# Patient Record
Sex: Female | Born: 1965 | Race: White | Hispanic: No | Marital: Married | Smoking: Former smoker
Health system: Southern US, Community
[De-identification: ages and names within clinical notes are randomized; demographics above are authoritative.]

## PROBLEM LIST (undated history)

## (undated) DIAGNOSIS — N926 Irregular menstruation, unspecified: Secondary | ICD-10-CM

## (undated) DIAGNOSIS — N920 Excessive and frequent menstruation with regular cycle: Secondary | ICD-10-CM

## (undated) HISTORY — PX: APPENDECTOMY: SHX54

## (undated) HISTORY — DX: Excessive and frequent menstruation with regular cycle: N92.0

## (undated) HISTORY — PX: GALLBLADDER SURGERY: SHX652

## (undated) HISTORY — PX: CHOLECYSTECTOMY: SHX55

## (undated) HISTORY — DX: Irregular menstruation, unspecified: N92.6

---

## 1998-08-26 ENCOUNTER — Other Ambulatory Visit: Admission: RE | Admit: 1998-08-26 | Discharge: 1998-08-26 | Payer: Self-pay | Admitting: Obstetrics and Gynecology

## 1999-08-27 ENCOUNTER — Other Ambulatory Visit: Admission: RE | Admit: 1999-08-27 | Discharge: 1999-08-27 | Payer: Self-pay | Admitting: Obstetrics and Gynecology

## 2000-09-22 ENCOUNTER — Other Ambulatory Visit: Admission: RE | Admit: 2000-09-22 | Discharge: 2000-09-22 | Payer: Self-pay | Admitting: Obstetrics and Gynecology

## 2009-09-09 ENCOUNTER — Ambulatory Visit: Payer: Self-pay | Admitting: Nurse Practitioner

## 2009-09-21 ENCOUNTER — Ambulatory Visit: Payer: Self-pay | Admitting: Nurse Practitioner

## 2009-09-21 IMAGING — US ULTRASOUND RIGHT BREAST
1 series · 7 of 7 positions shown · non-contrast
Comparison: none

REASON FOR EXAM: Bilateral density
COMMENTS:

[Series 1: ultrasound right breast · 7 of 7 slices shown]
[im 1/7]
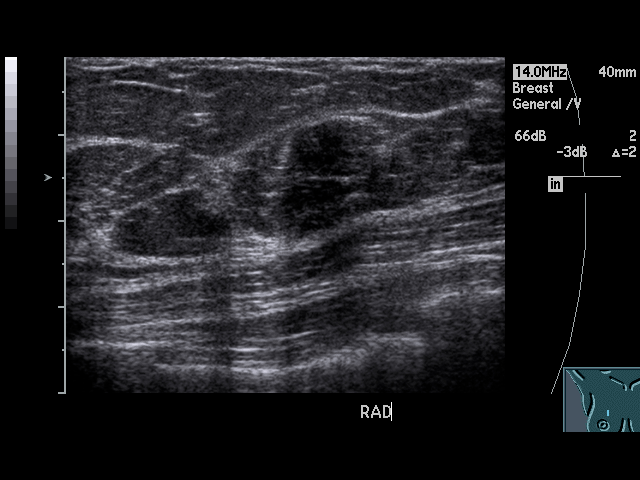
[im 2/7]
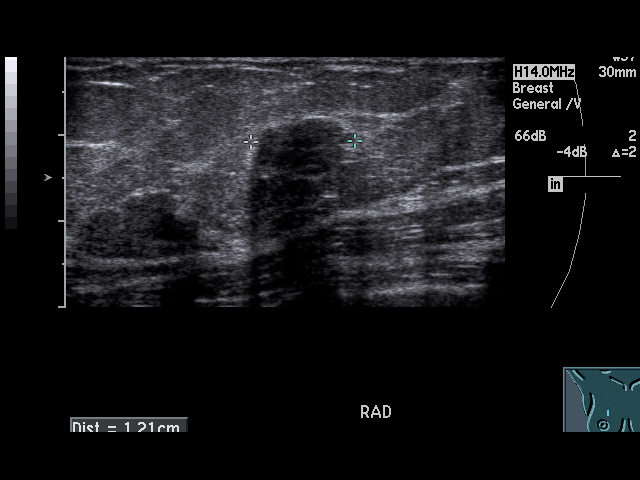
[im 3/7]
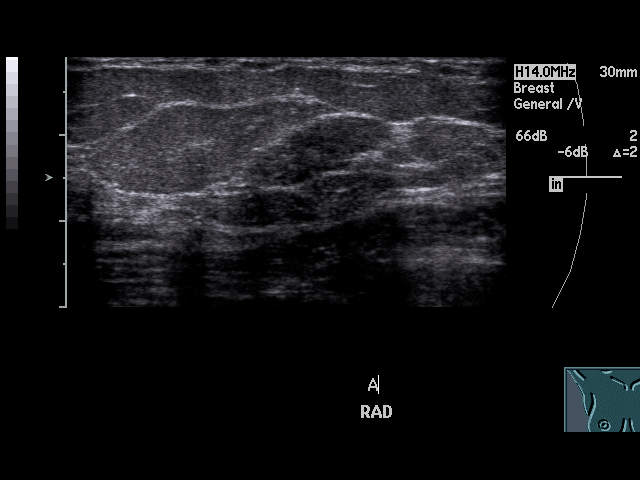
[im 4/7]
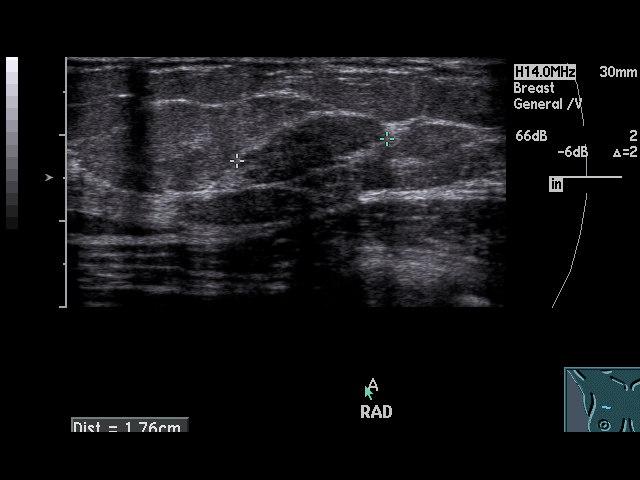
[im 5/7]
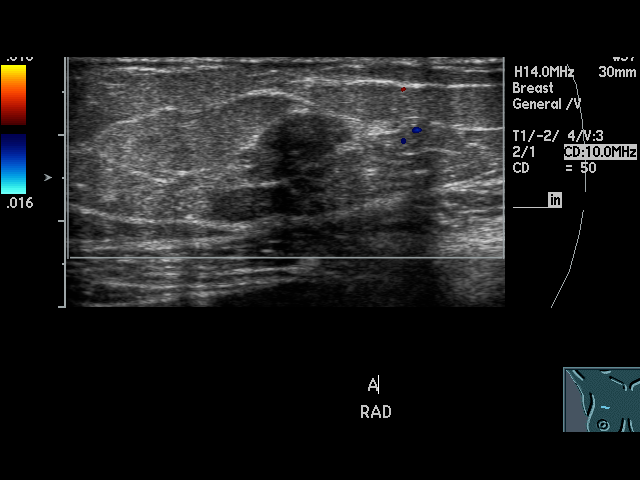
[im 6/7]
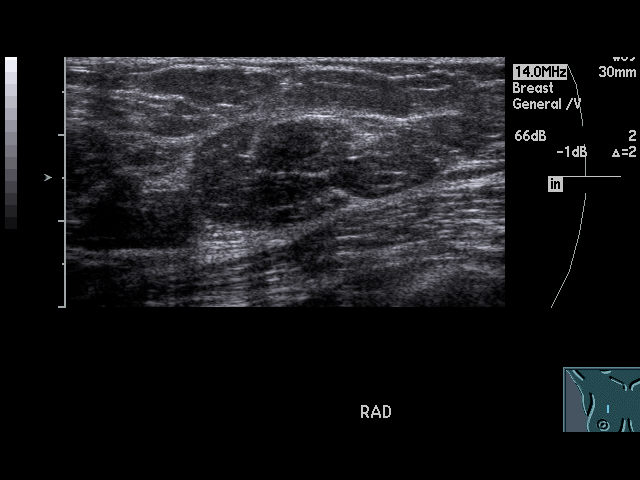
[im 7/7]
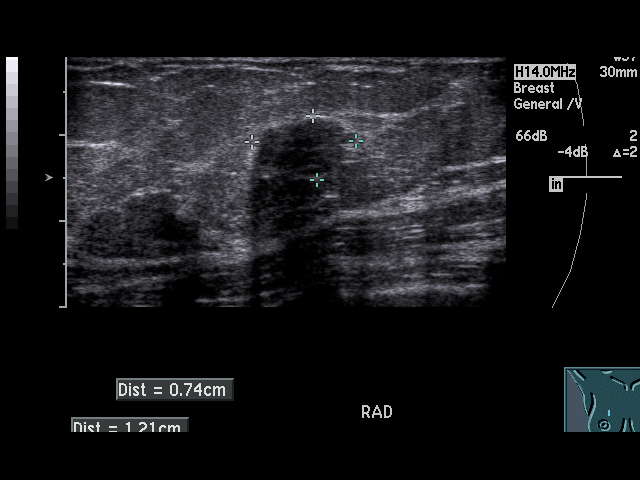

[7 of 7 positions shown; findings below may reference images not displayed]

PROCEDURE:     US  - US WOODBERRY RT BREAST  - [DATE] [DATE]

RESULT:     The right breast is evaluated in the region of interest.

At the 1 o'clock position a solid appearing nodule is identified measuring
1.2 x .74 x 1.76 cm.  This area is oval shaped and demonstrates a primarily
smoothly marginated border with regions of lobulation. There is acoustic
shadowing associated with this area and no evidence of significant flow.
Sonographic appearance of this nodule is indeterminate. Please refer to the
additional radiographic mammogram for completed discussion.
IMPRESSION: 1.Indeterminate nodule at the 1 o'clock position.

## 2009-09-21 IMAGING — US ULTRASOUND LEFT BREAST
1 series · 10 of 10 positions shown · non-contrast
Comparison: none

REASON FOR EXAM: Bilateral density
COMMENTS:

[Series 1: ultrasound left breast · 10 of 10 slices shown]
[im 1/10]
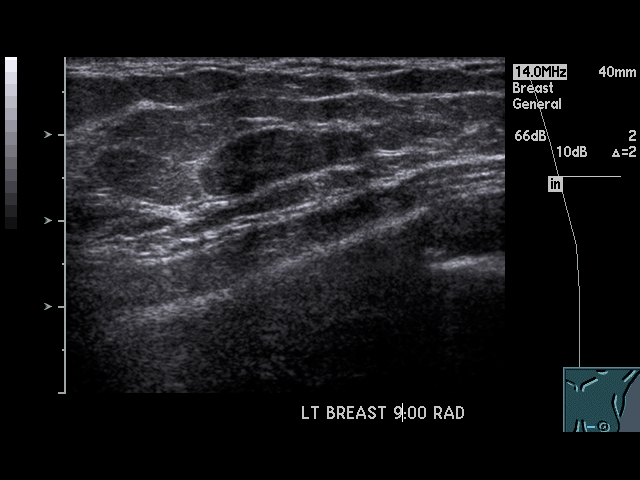
[im 2/10]
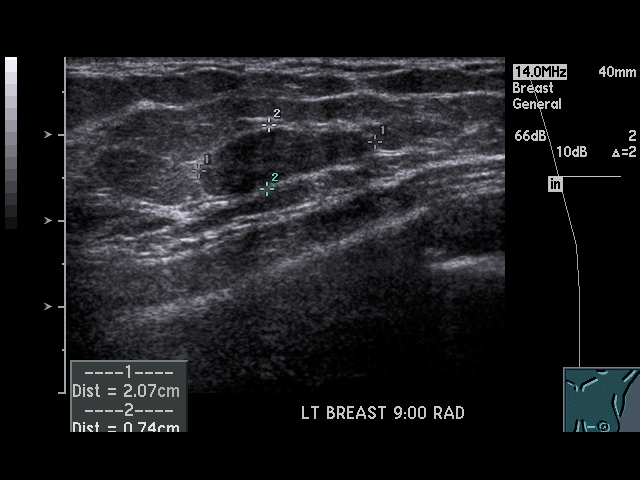
[im 3/10]
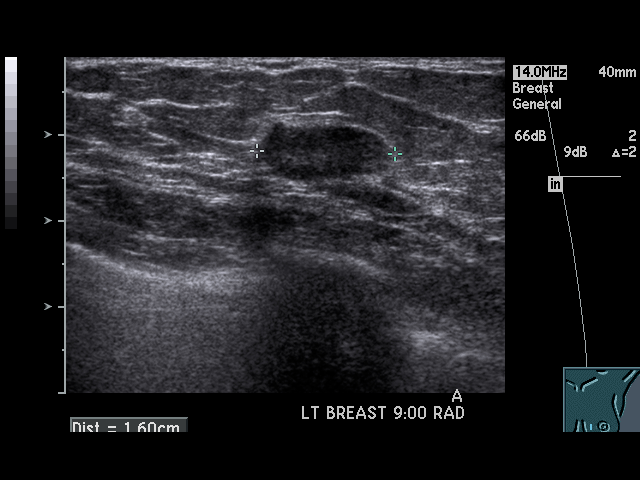
[im 4/10]
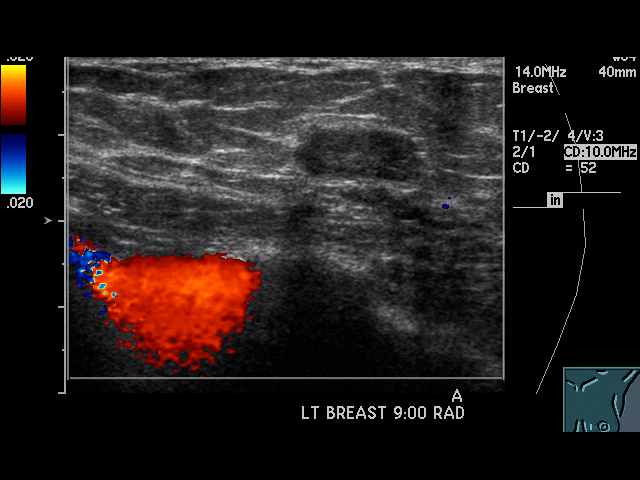
[im 5/10]
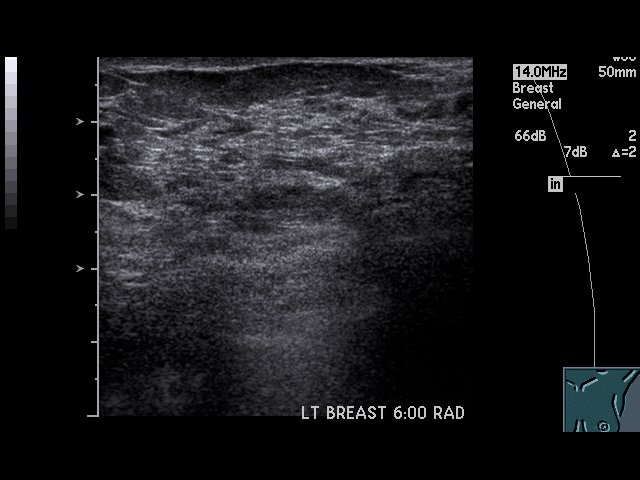
[im 6/10]
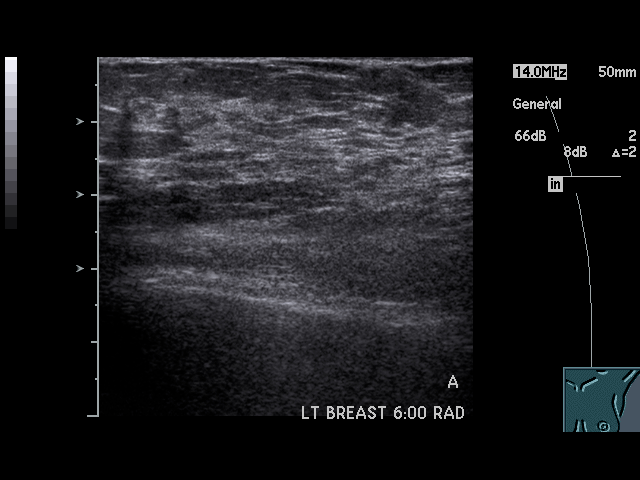
[im 7/10]
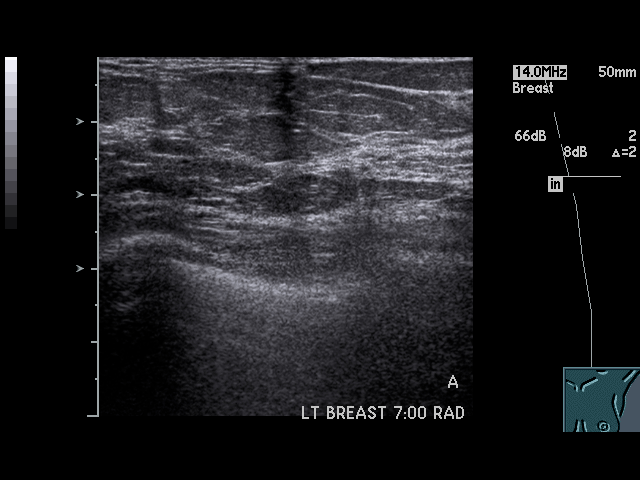
[im 8/10]
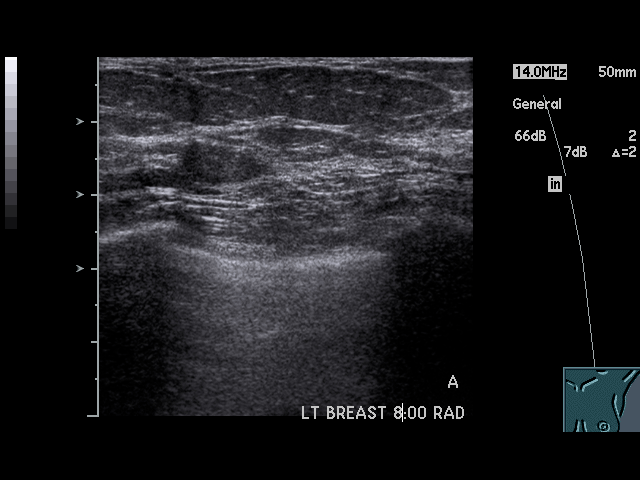
[im 9/10]
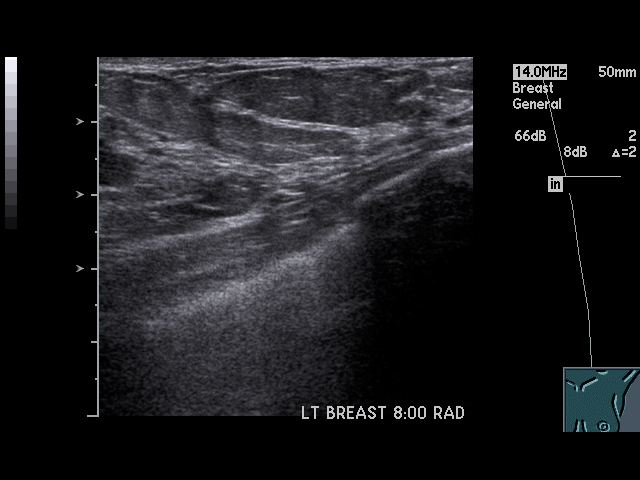
[im 10/10]
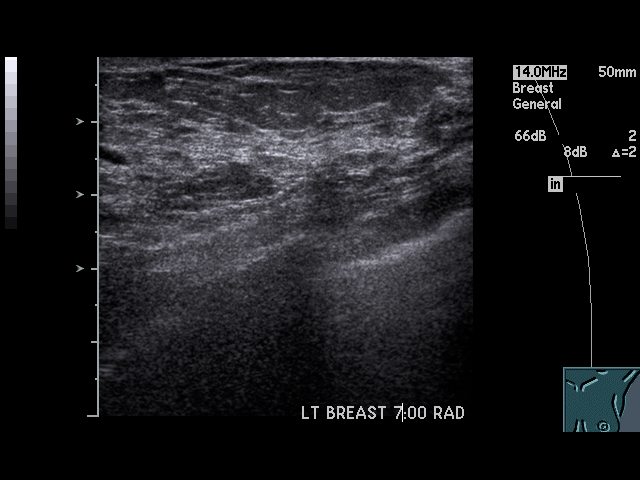

[10 of 10 positions shown; findings below may reference images not displayed]

PROCEDURE:     US  - US CHAHAL LT BREAST  - [DATE] [DATE]

RESULT:       The left breast was evaluated in the region of interest from
the 6 o'clock to the 9 o'clock position.

At the 9 o'clock position, an oval-shaped hypoechoic nodule is identified
demonstrating increased through transmission with a solid appearance. The
borders of this nodule are smoothly marginated. The nodule appears to e
wider than tall and there is no evidence of associated flow.  The
sonographic appearance of this nodule suggest the sequela of a fibroadenoma
which is a benign entity.
IMPRESSION: Findings suggesting benign adenoma in the region of interest at the left
breast. Please refer to the additional mammographic view dictation for
complete discussion.

## 2009-10-02 ENCOUNTER — Ambulatory Visit: Payer: Self-pay

## 2009-10-07 ENCOUNTER — Ambulatory Visit: Payer: Self-pay | Admitting: Surgery

## 2009-10-07 IMAGING — US ULTRASOUND RIGHT BREAST
1 series · 5 of 5 positions shown · non-contrast
Comparison: none

REASON FOR EXAM: Rt Breast Mass
COMMENTS:

[Series 1: ultrasound right breast · 5 of 5 slices shown]
[im 1/5]
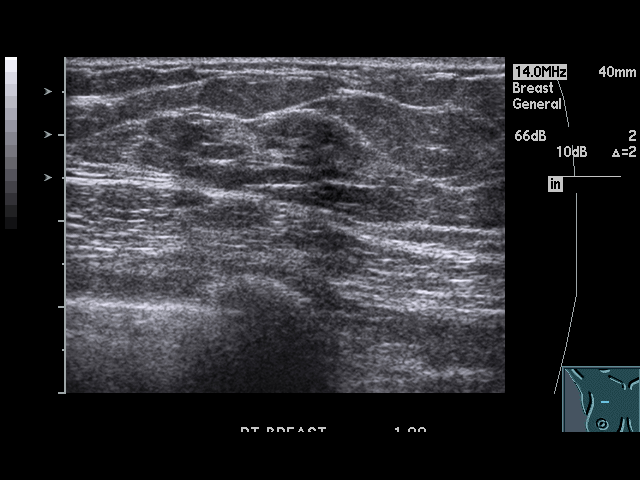
[im 2/5]
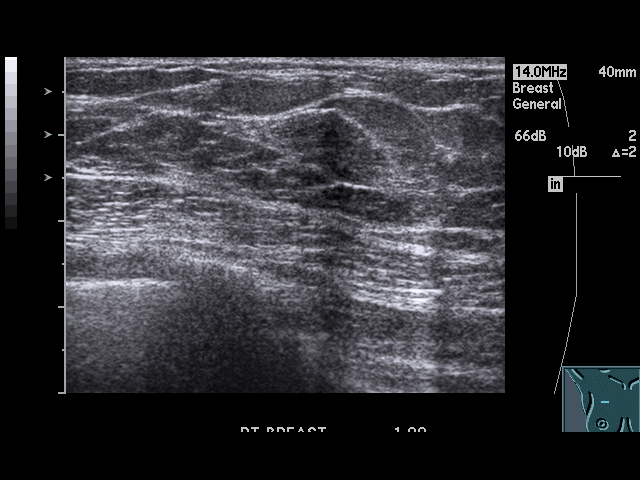
[im 3/5]
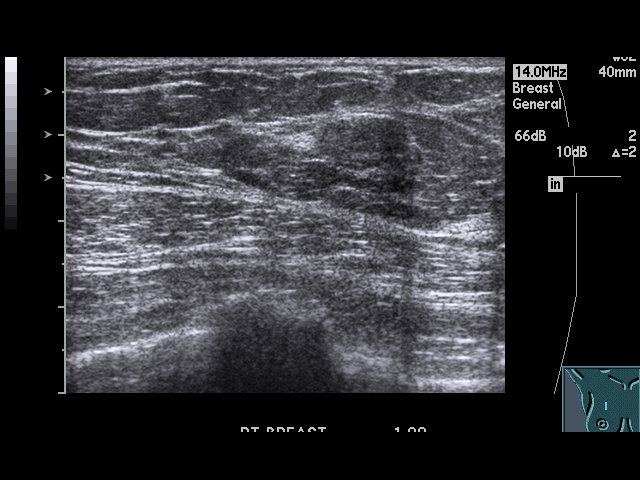
[im 4/5]
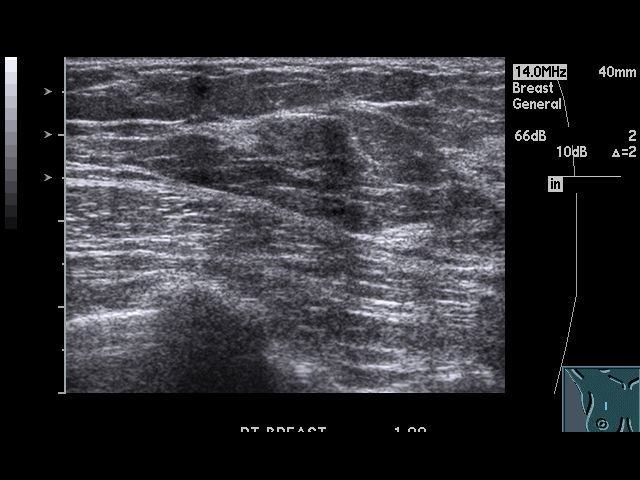
[im 5/5]
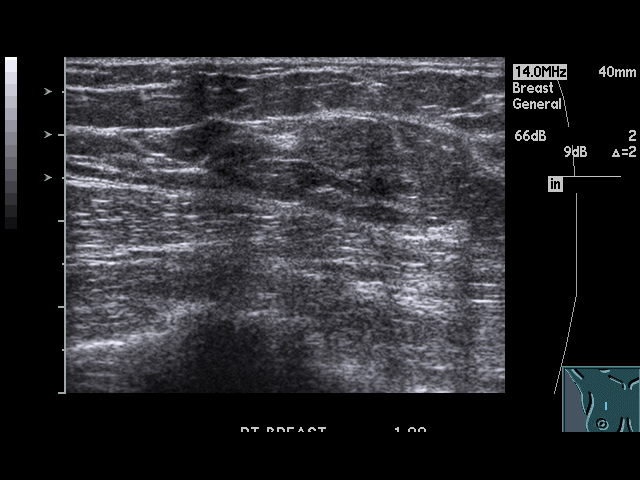

[5 of 5 positions shown; findings below may reference images not displayed]

PROCEDURE:     US  - US BREAST RIGHT  - [DATE]  [DATE]

RESULT:     Right breast ultrasound was performed. Previously identified
questionable lesion in the right breast is no longer identified. The lesion
appears to be normal breast tissue. To assure stability, a followup right
breast ultrasound in 3 months suggested. This was discussed with the patient.
IMPRESSION: Recommend followup right breast ultrasound in 3 months as described above.
Biopsy was not performed as what appeared to represent a lesion on prior
ultrasound appears to represent normal tissue on today's exam.

## 2009-10-07 IMAGING — US US BREAST BX W LOC DEV 1ST LESION IMG BX SPEC US GUIDE
1 series · 8 of 8 positions shown · non-contrast
Comparison: none

REASON FOR EXAM: Lt Breast Mass
COMMENTS:

[Series 1: us breast bx w loc dev 1st lesion img bx spec us g · 8 of 8 slices shown]
[im 1/8]
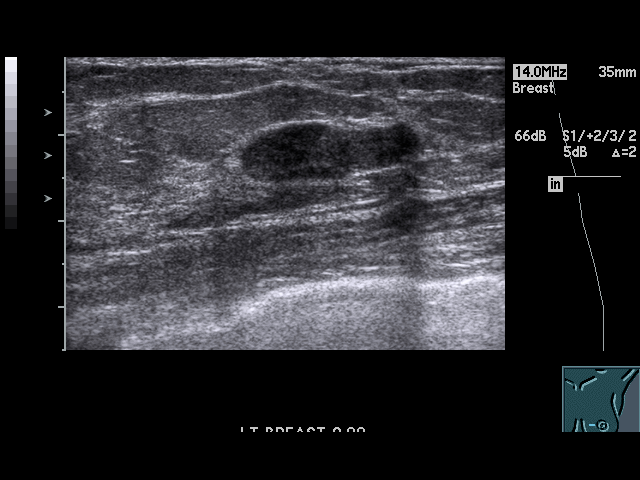
[im 2/8]
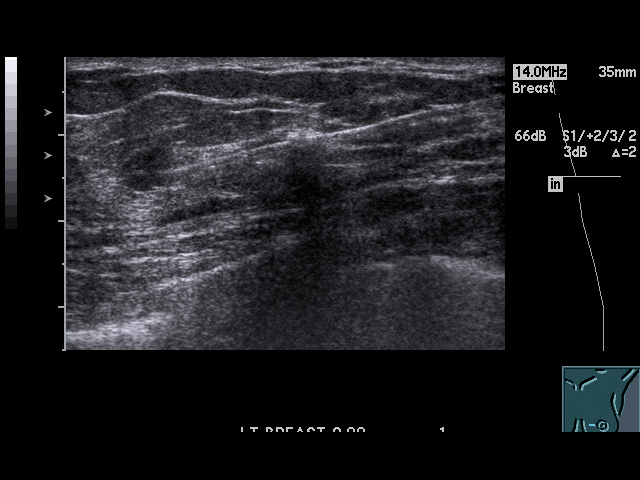
[im 3/8]
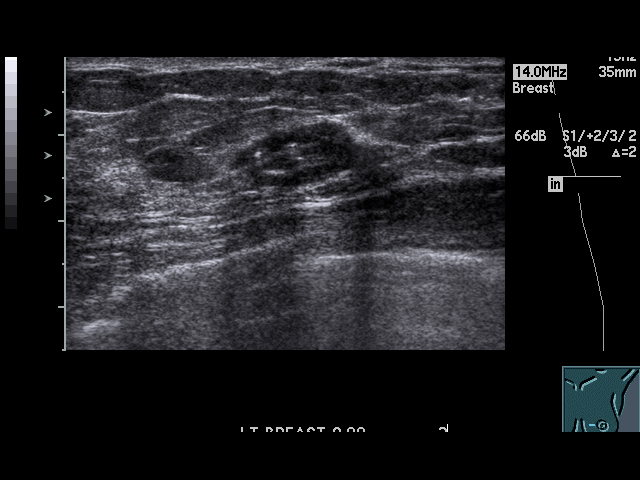
[im 4/8]
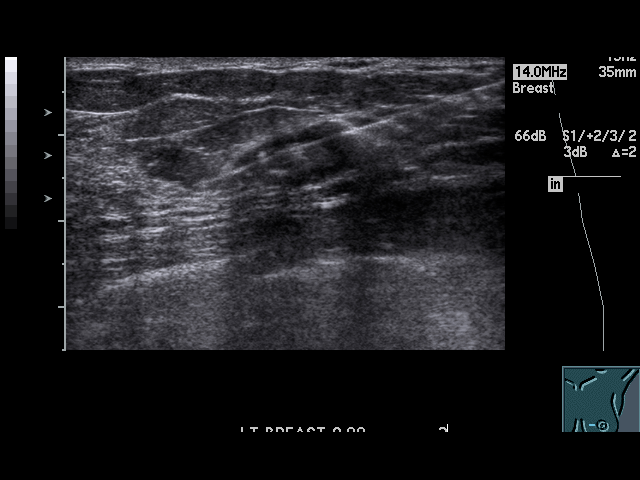
[im 5/8]
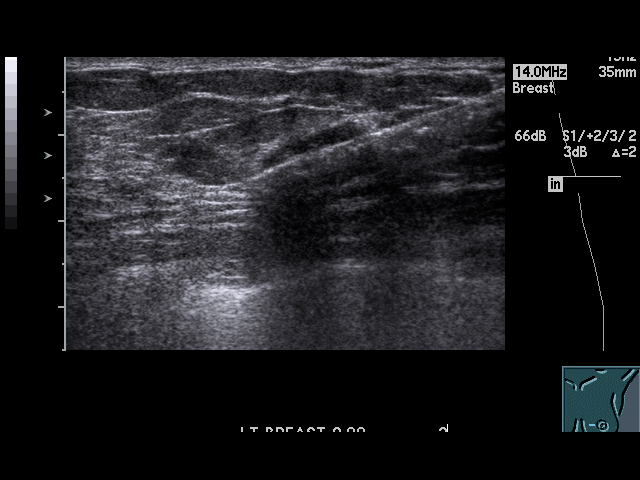
[im 6/8]
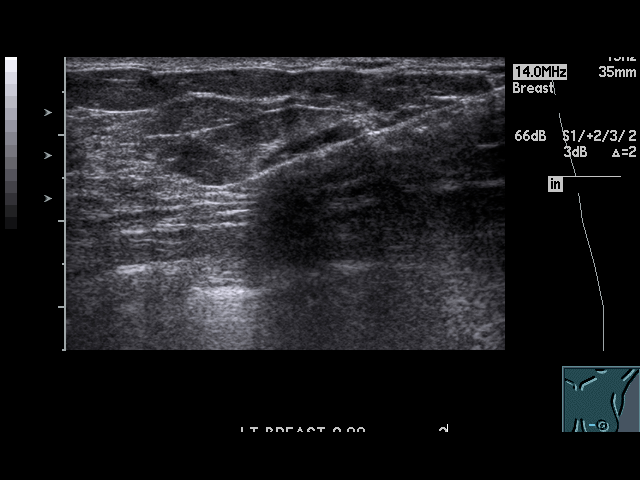
[im 7/8]
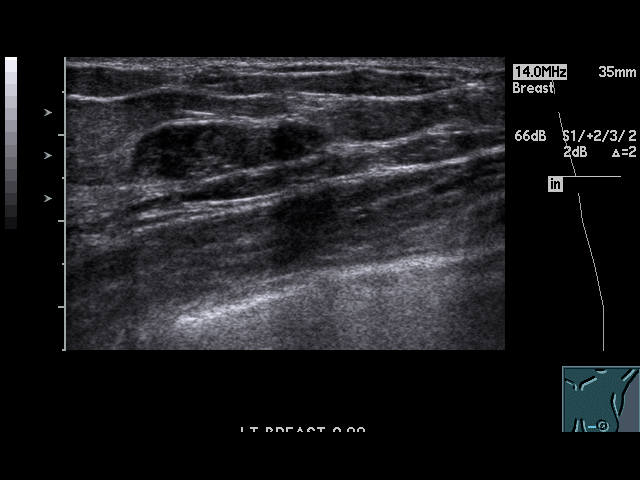
[im 8/8]
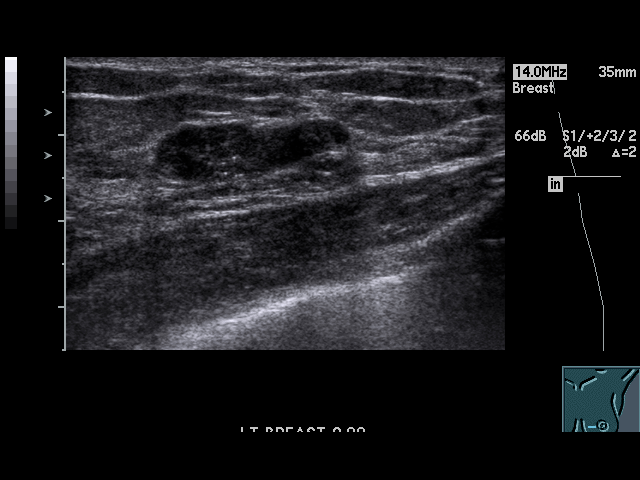

[8 of 8 positions shown; findings below may reference images not displayed]

PROCEDURE:     US  - US GUIDED BIOPSY BREAST LEFT  - [DATE]  [DATE]

RESULT:     After discussing the risks and benefits of this procedure with
the patient, informed consent was obtained. The left breast was sterilely
prepped and draped.  Following local anesthesia with 1% lidocaine, a
16-gauge Achieve Core Biopsy System was advanced into the left breast mass
and multiple core samples were obtained. There were no complications.
IMPRESSION: Successful left breast ultrasound-directed biopsy. If a definitive diagnosis
such as fibroadenoma or malignancy is not returned, surgical removal of this
mass would be indicated.

## 2012-11-06 ENCOUNTER — Ambulatory Visit: Payer: Self-pay | Admitting: Nurse Practitioner

## 2012-11-06 IMAGING — US ULTRASOUND RIGHT BREAST
1 series · 14 of 16 positions shown · non-contrast
Comparison: none

REASON FOR EXAM: RT BR NODULE FU AND YRLY
COMMENTS:

PROCEDURE:     US  - US BREAST RIGHT  - [DATE]  [DATE]
RESULT:

[Series 1: ultrasound right breast · 0.09mm/px · 14 of 16 slices shown]
[im 1/16]
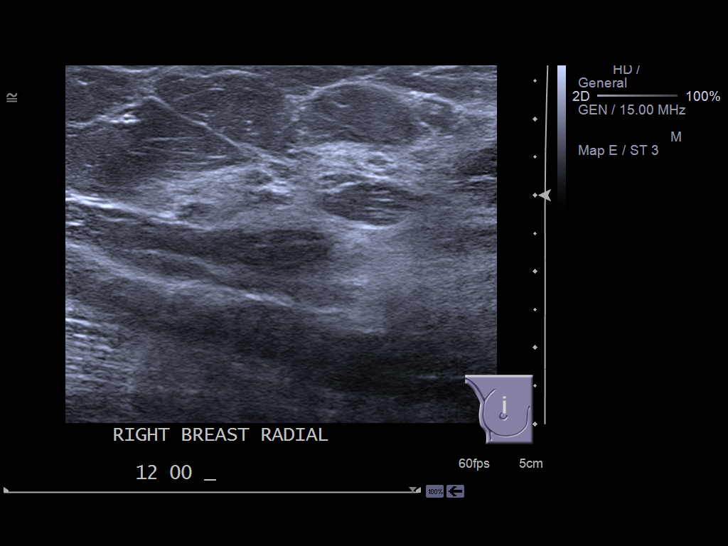
[im 2/16]
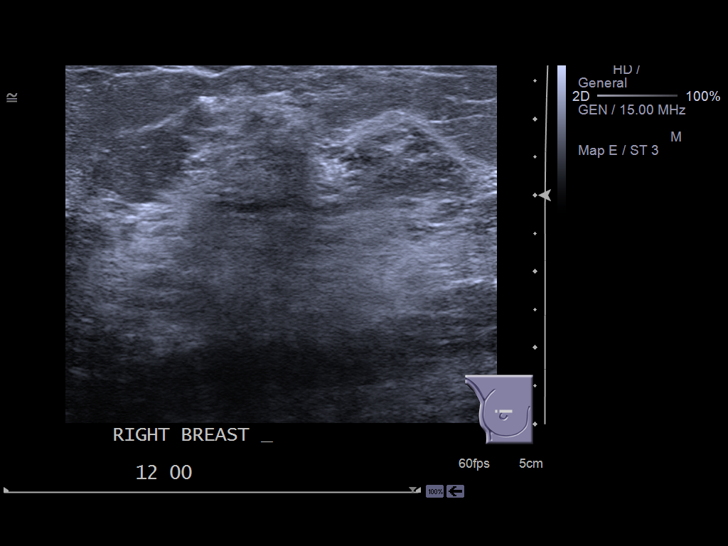
[im 3/16]
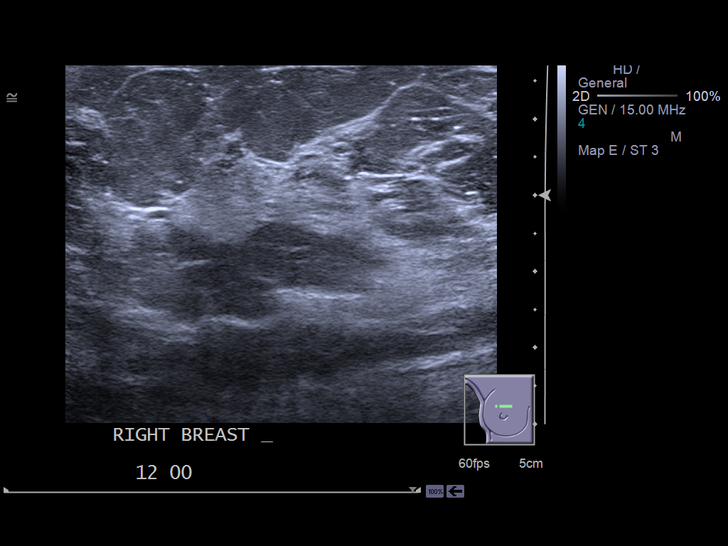
[im 5/16]
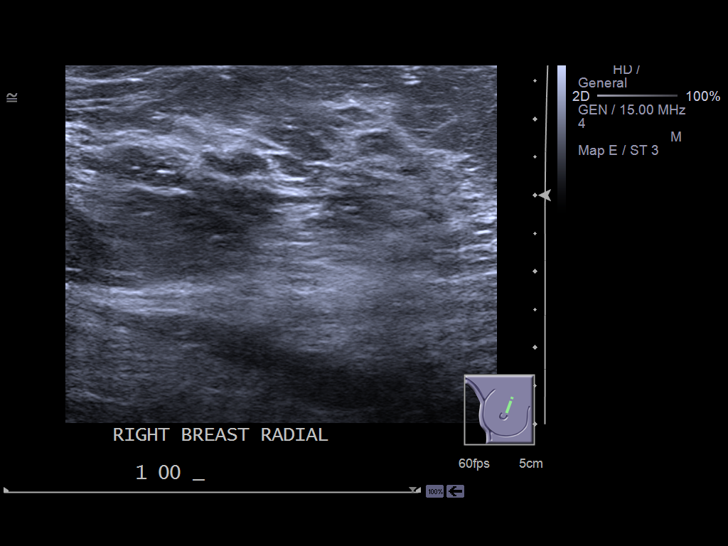
[im 6/16]
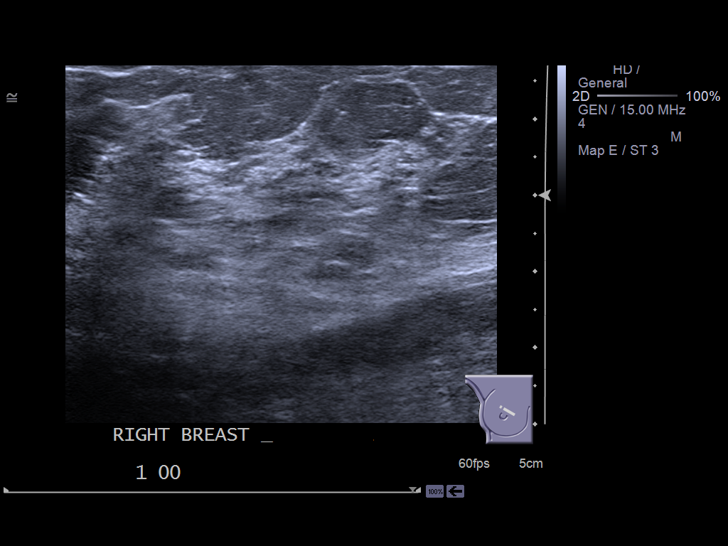
[im 7/16]
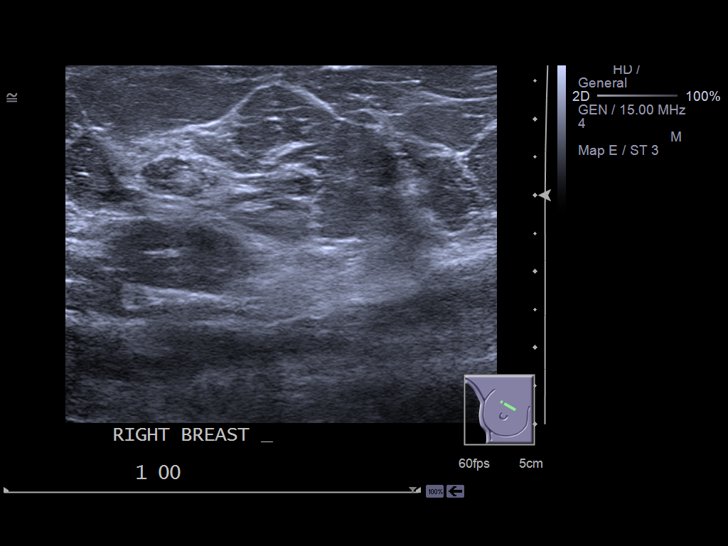
[im 8/16]
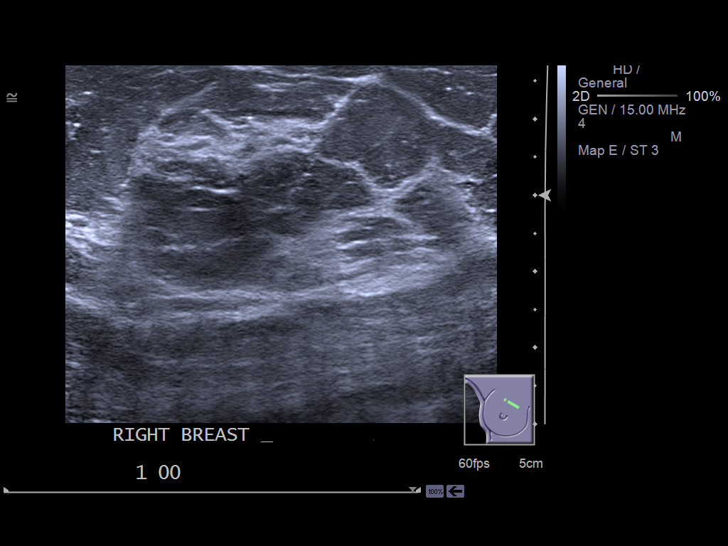
[im 9/16]
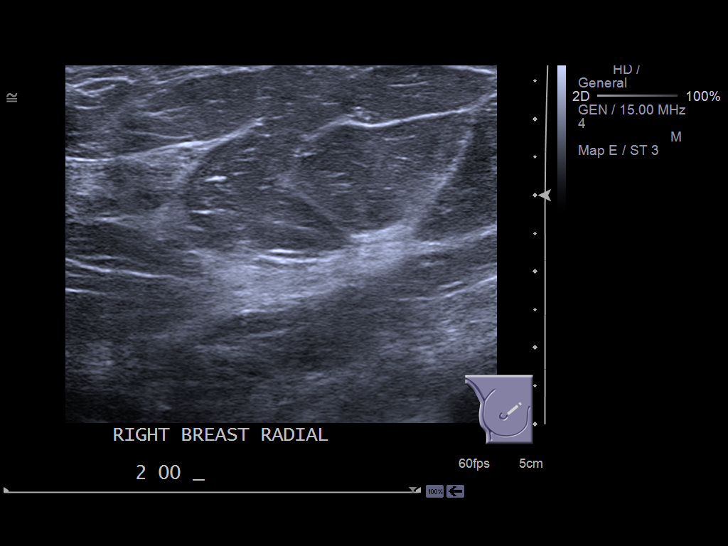
[im 10/16]
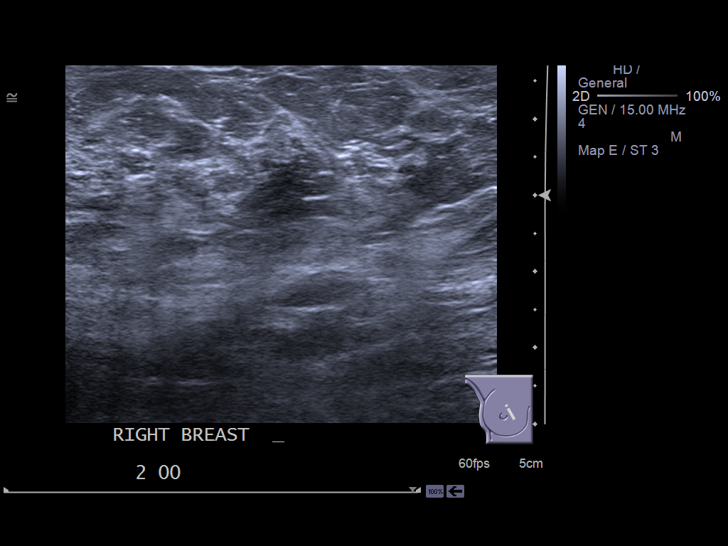
[im 11/16]
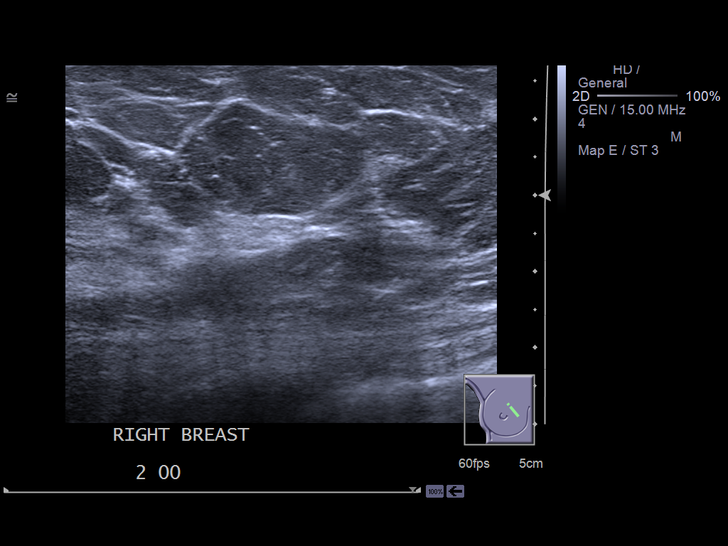
[im 13/16]
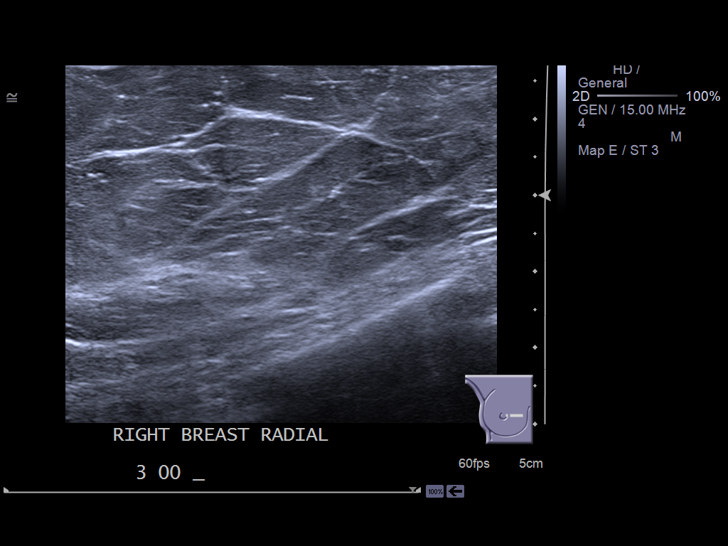
[im 14/16]
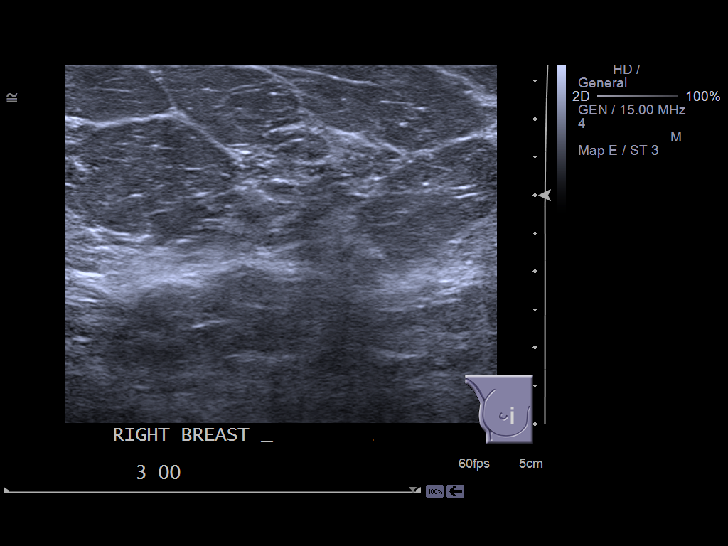
[im 15/16]
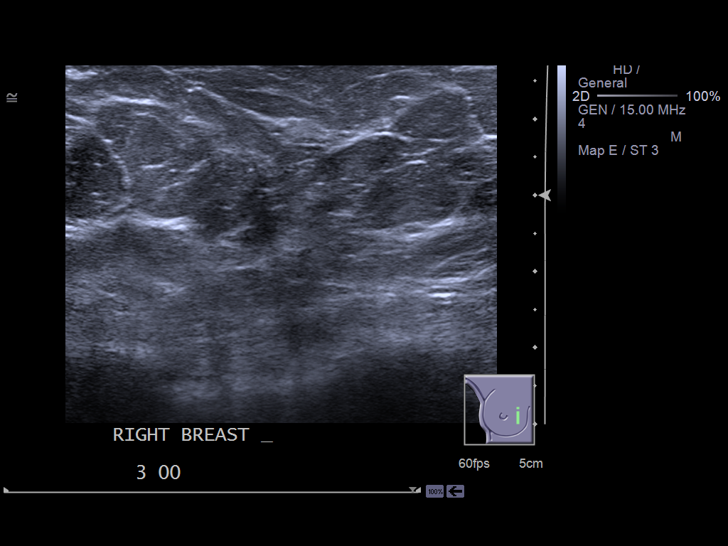
[im 16/16]
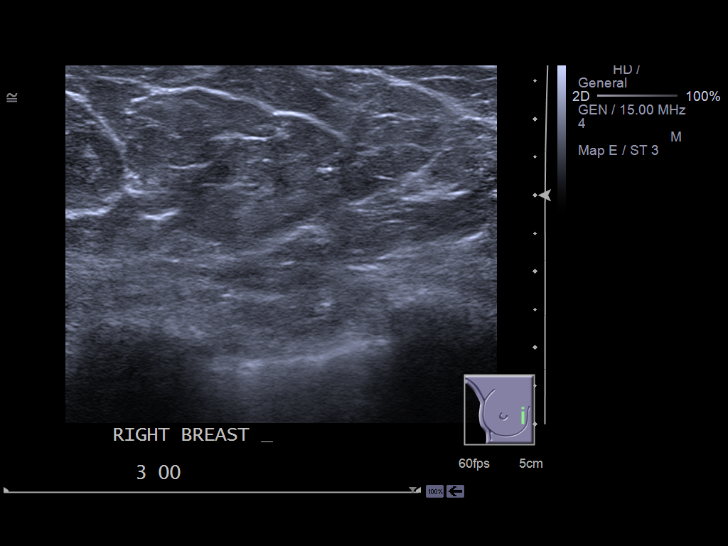

[14 of 16 positions shown; findings below may reference images not displayed]

FINDINGS: The region of interest within the right breast was evaluated from
the [DATE] to [DATE] position. No solid or cystic sonographic abnormality is
identified. This study was compared to a prior study dated [DATE].
IMPRESSION: Unremarkable focused Right Breast Ultrasound.

## 2012-11-13 ENCOUNTER — Ambulatory Visit: Payer: Self-pay | Admitting: Nurse Practitioner

## 2012-11-13 IMAGING — US ULTRASOUND LEFT BREAST
1 series · 14 of 25 positions shown · non-contrast
Comparison: none

REASON FOR EXAM: av lt increased density
COMMENTS:

PROCEDURE:     US  - US BREAST LEFT  - [DATE]  [DATE]
RESULT:

[Series 1: ultrasound left breast · 0.08mm/px · 14 of 39 slices shown]
[im 1/39]
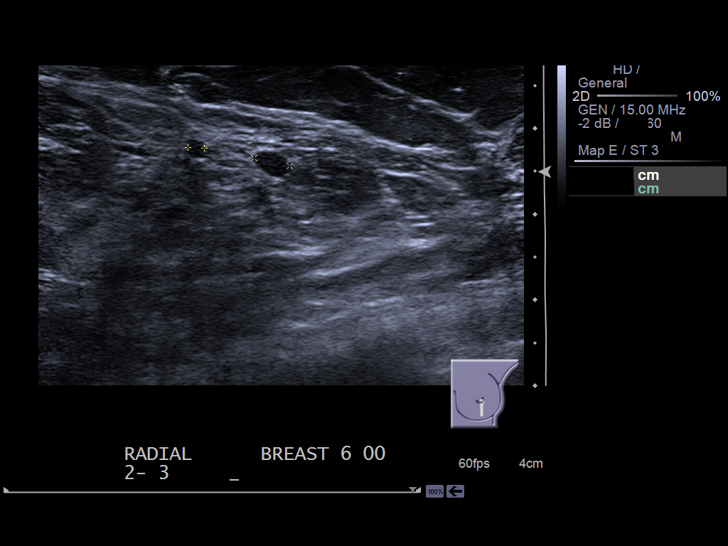
[im 4/39]
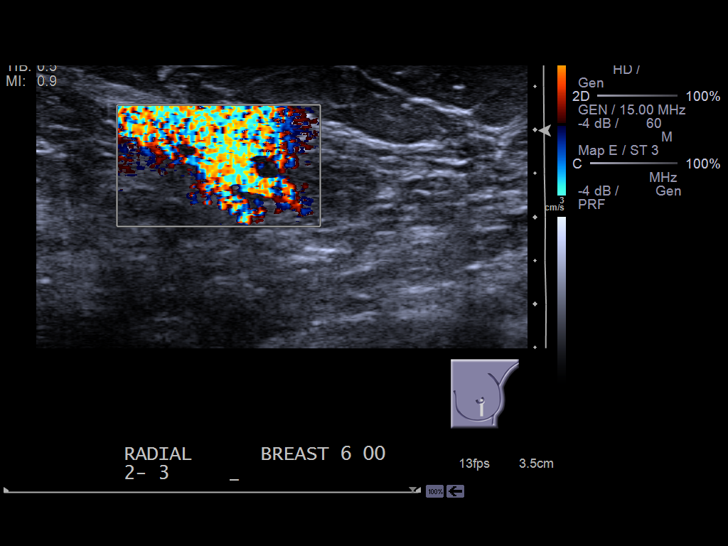
[im 7/39]
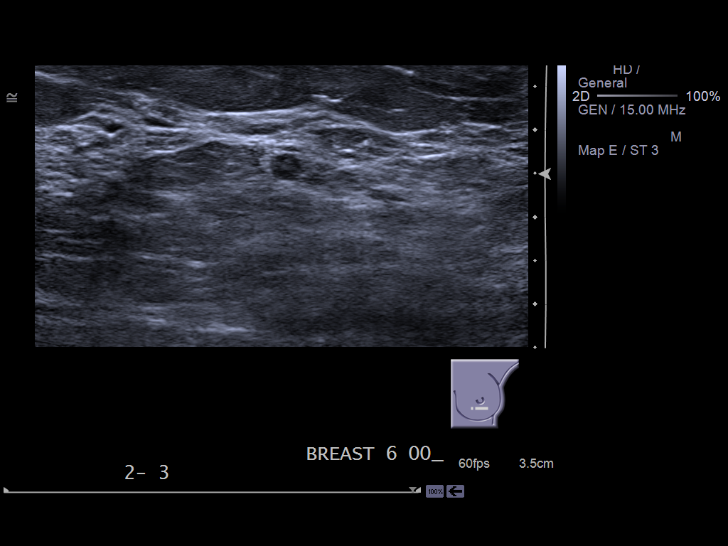
[im 10/39]
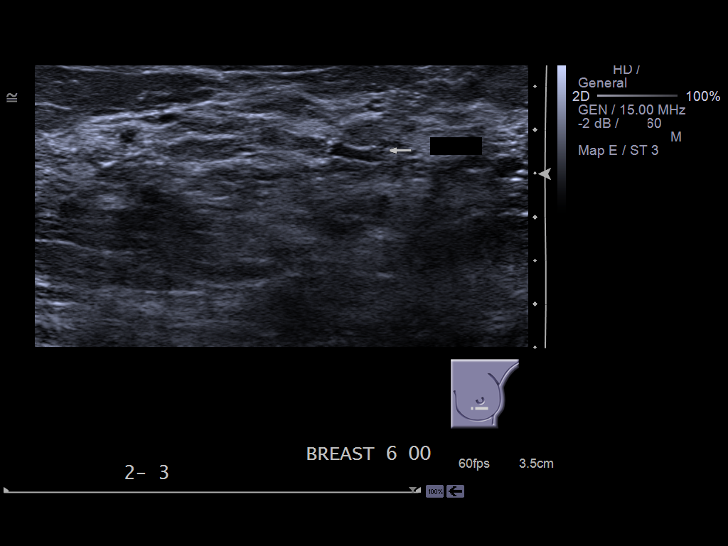
[im 13/39]
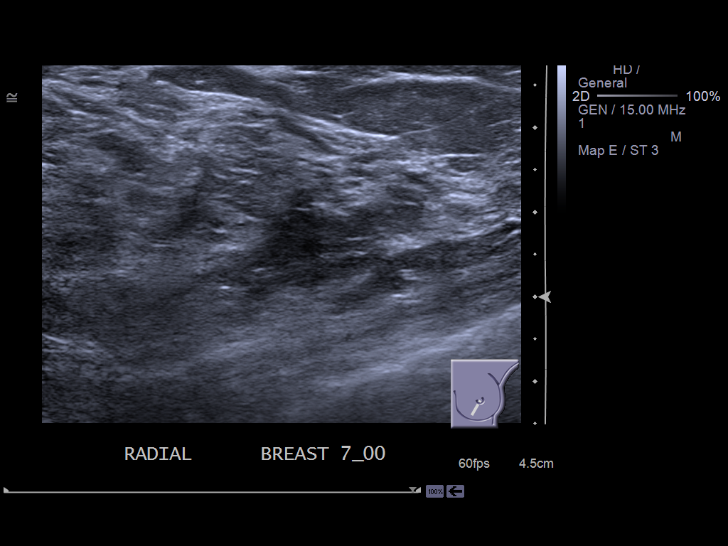
[im 15/39]
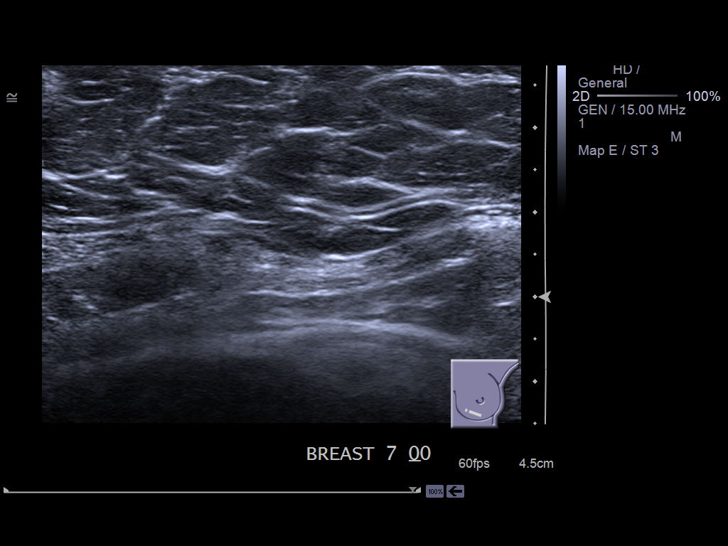
[im 18/39]
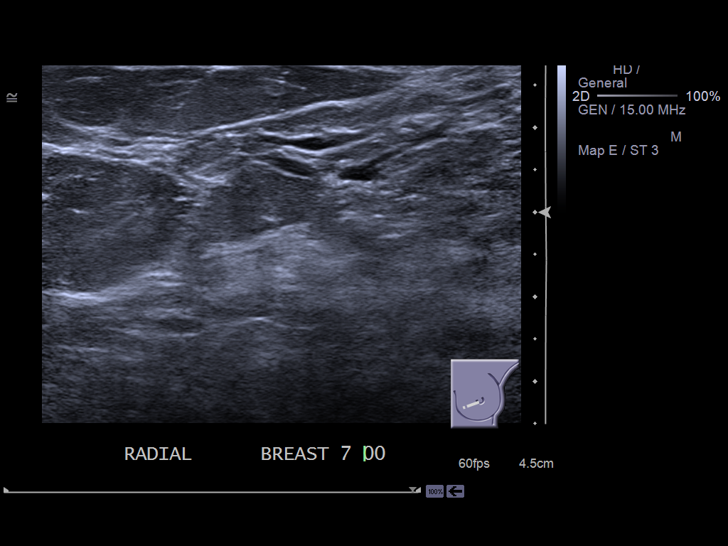
[im 21/39]
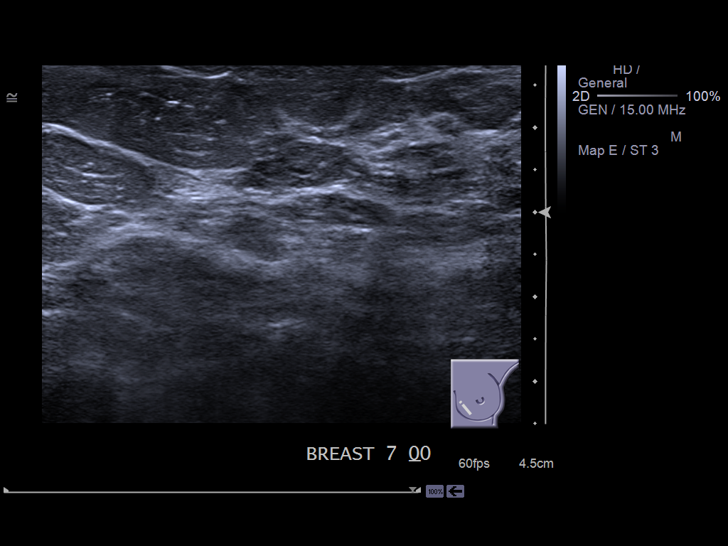
[im 24/39]
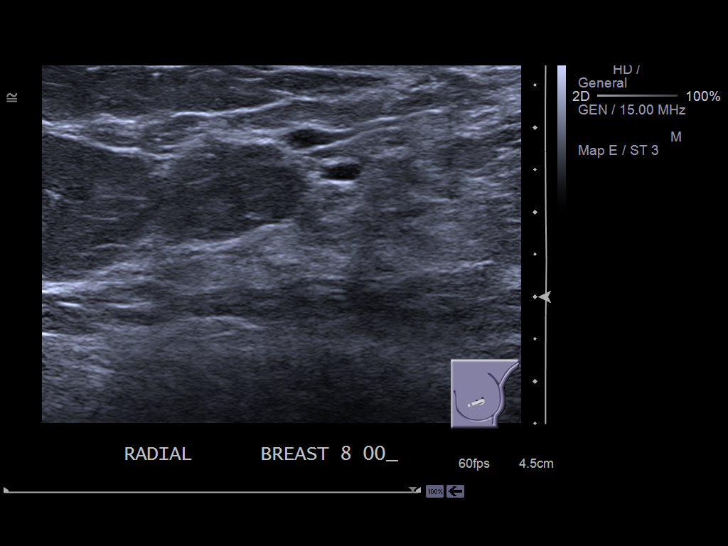
[im 26/39]
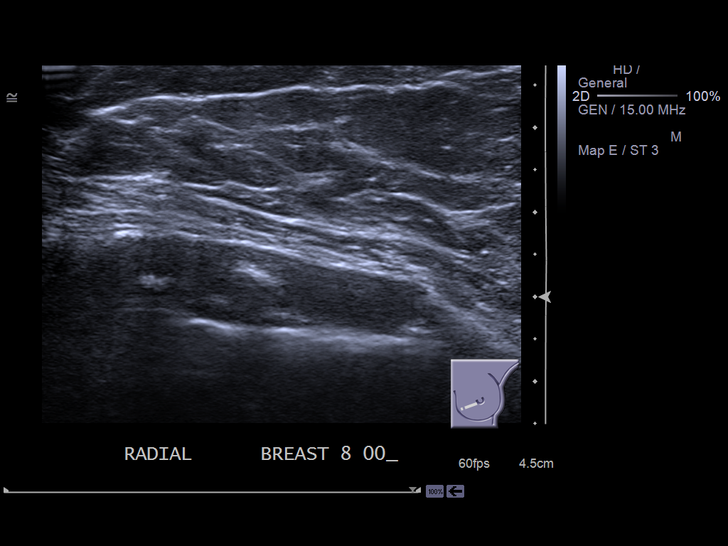
[im 29/39]
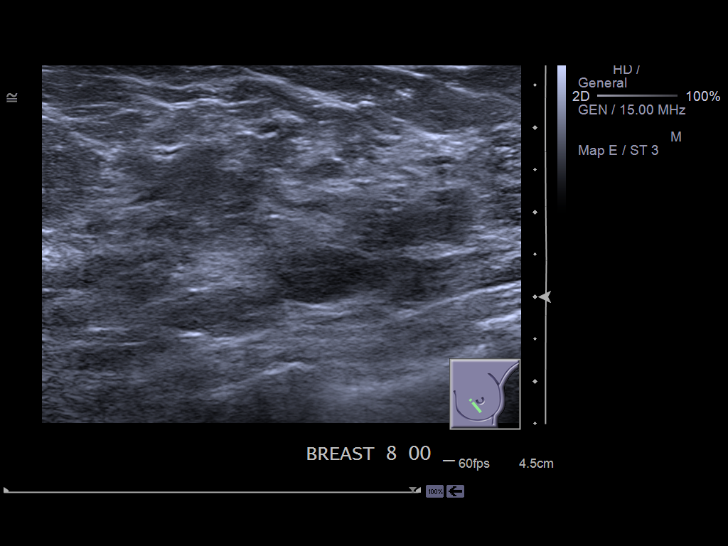
[im 32/39]
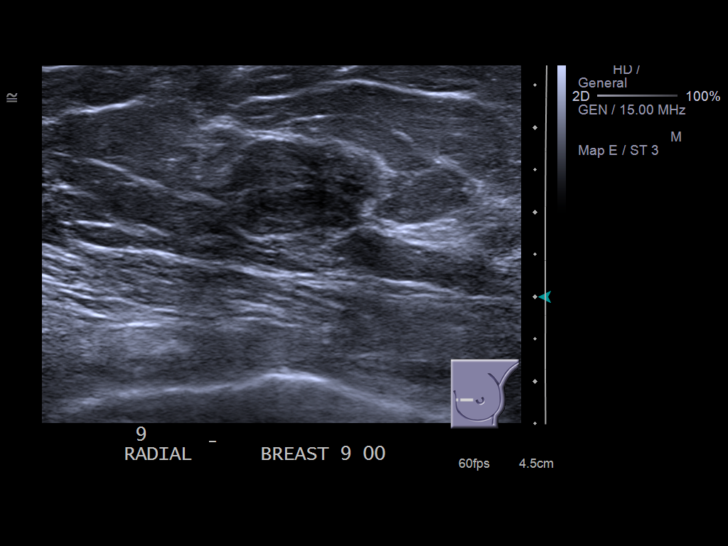
[im 35/39]
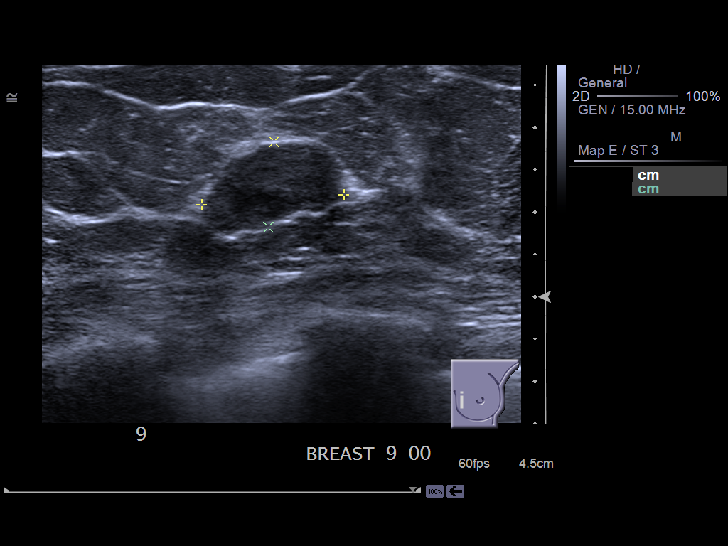
[im 39/39]
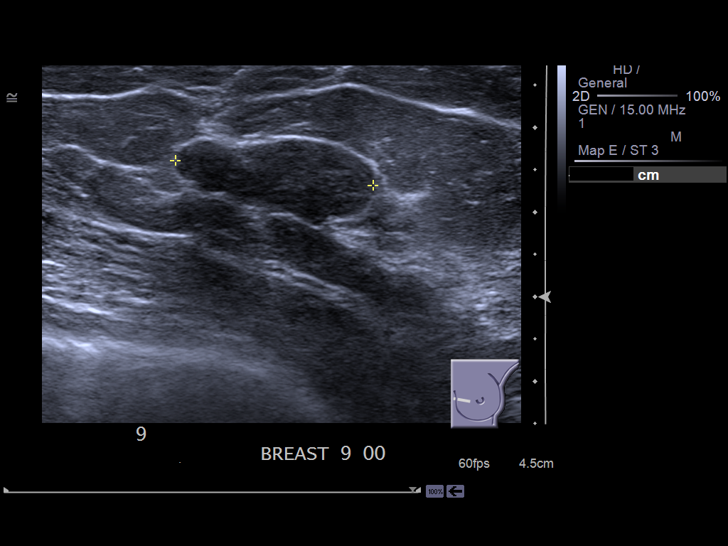

[14 of 25 positions shown; findings below may reference images not displayed]

FINDINGS: The left breast was evaluated in the region of interest. At the
[DATE] position, 3 cm from the nipple, a 0.42 x 0.32 x 0.88 cm, hypoechoic
area is identified. A smaller adjacent area is also appreciated which
appears to be consistent with a duct. At the [DATE] position, about 9 cm from
the nipple, a 2.4 x 1.68 x 1.01 cm, smoothly marginated, hypoechoic nodule
is appreciated, biopsy proven benign fibroadenoma. The findings at the [DATE]
position are likely benign and reevaluation in six months is recommended.
IMPRESSION: Likely benign findings. Six month reevaluation is
recommended.

## 2013-05-15 ENCOUNTER — Ambulatory Visit: Payer: Self-pay

## 2013-05-23 ENCOUNTER — Ambulatory Visit: Payer: Self-pay

## 2013-05-23 IMAGING — US ULTRASOUND LEFT BREAST
1 series · 4 of 4 positions shown · non-contrast
Comparison: Recent mammogram and prior exams [DATE] and
earlier

On physical exam, I palpate no abnormality in the left breast nine
o'clock area.

REASON FOR EXAM: us lt density ABACHO ph [PHONE_NUMBER] fx [phone]
[PHONE_NUMBER]
COMMENTS:

PROCEDURE:     MAM - MAM US BREAST LEFT  - [DATE] [DATE]
CLINICAL DATA: Short-term follow-up left breast mass 9 o'clock
location.
LEFT BREAST ULTRASOUND

[Series 1: ultrasound left breast · 0.08mm/px · 4 of 4 slices shown]
[im 1/4]
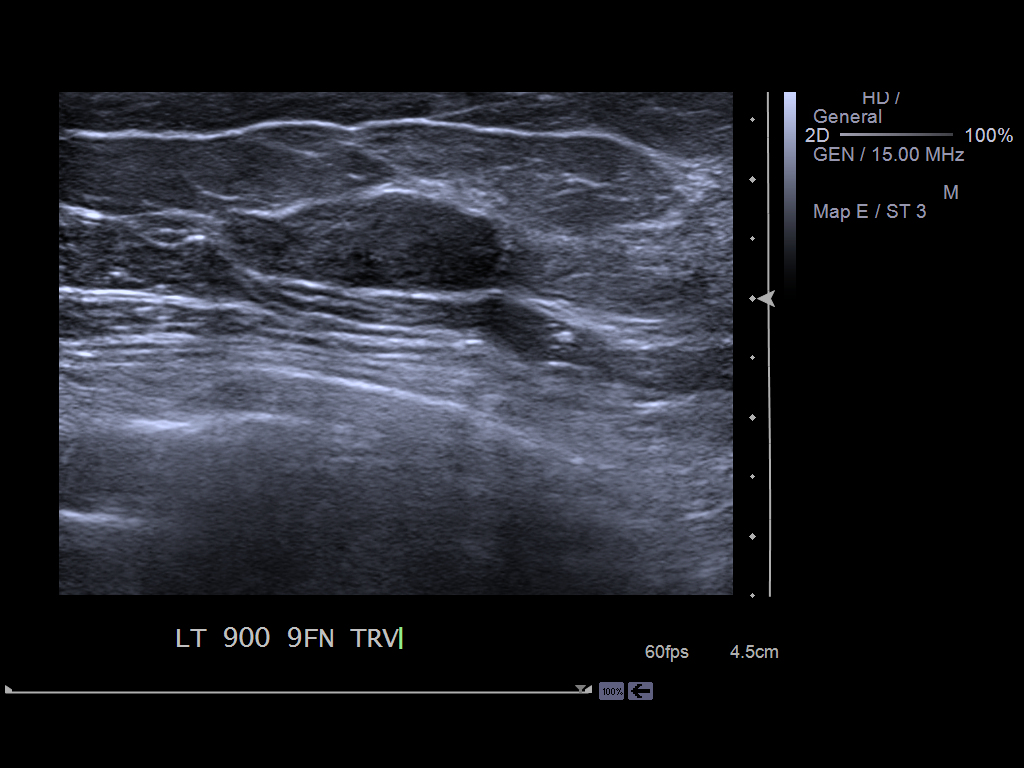
[im 2/4]
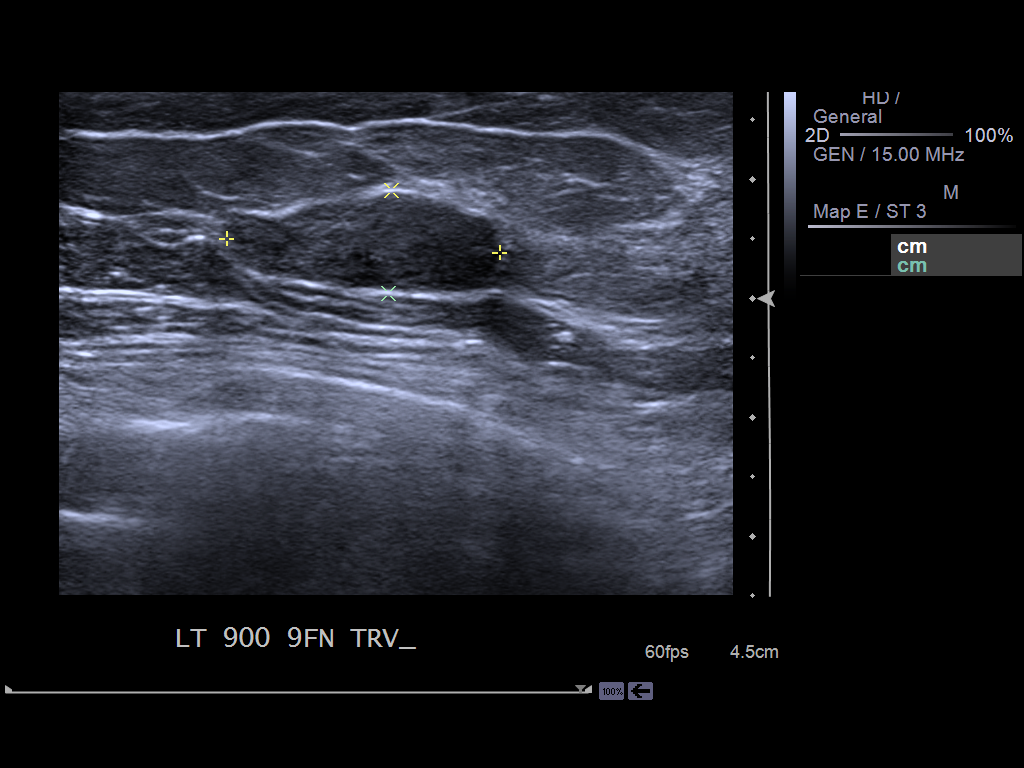
[im 3/4]
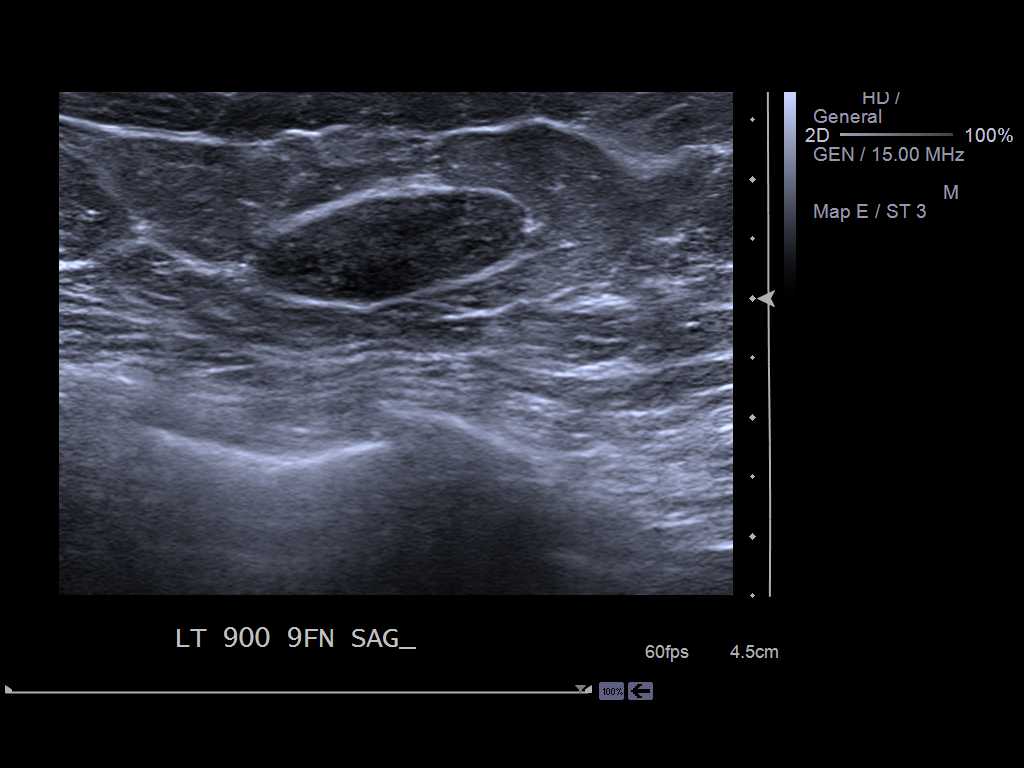
[im 4/4]
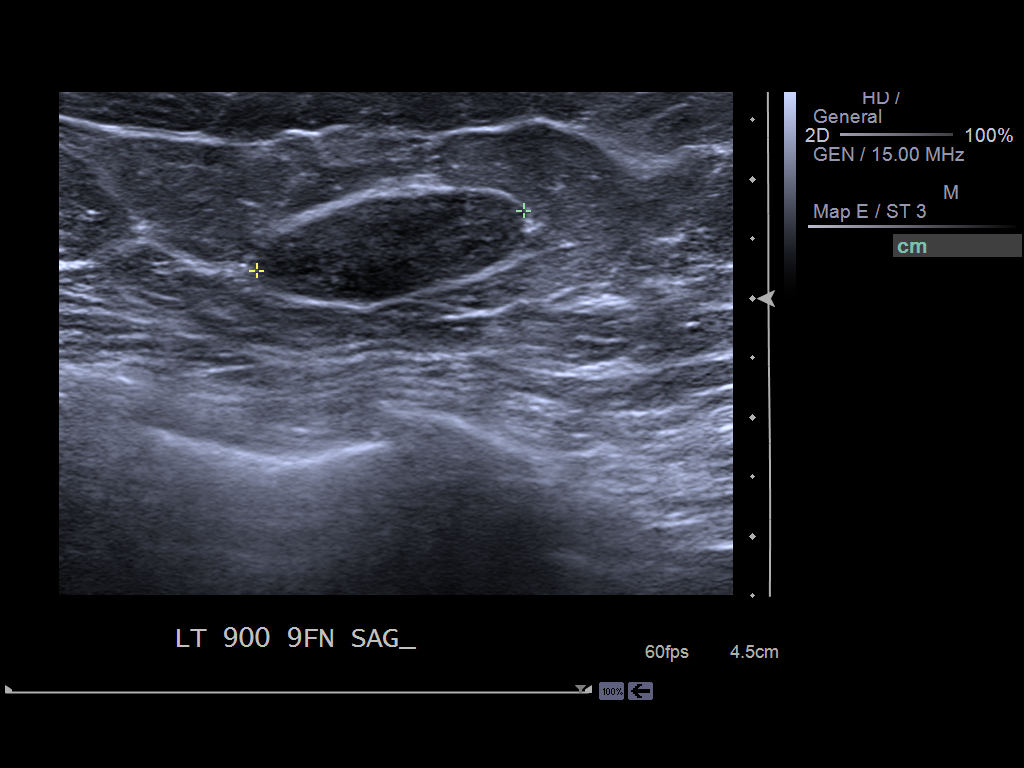

[4 of 4 positions shown; findings below may reference images not displayed]

FINDINGS: Ultrasound is performed, showing an oval hypoechoic
circumscribed mass in the left breast nine o'clock location 9 cm
from the nipple measuring 2.3 x 2.3 x 0.9 cm (previously 2.4 x
x 0 cm).  Allowing for differences in technique, this is unchanged.
IMPRESSION: Stable probably benign left breast mass 9 o'clock location with
imaging features most suggestive of a fibroadenoma.  The patient is
counseled to contact us with any clinical change.  Otherwise,
bilateral diagnostic mammography and possibly left breast
ultrasound are recommended [DATE] for surveillance.

RECOMMENDATION:
Bilateral diagnostic mammography and left breast ultrasound
[DATE]

I have discussed the findings and recommendations with the patient.
Results were also provided in writing at the conclusion of the
visit.  If applicable, a reminder letter will be sent to the
patient regarding the next appointment.

BI-RADS CATEGORY 3:  Probably benign finding(s) - short interval
follow-up suggested.

## 2013-12-03 ENCOUNTER — Ambulatory Visit: Payer: Self-pay

## 2013-12-03 HISTORY — PX: BREAST BIOPSY: SHX20

## 2013-12-03 IMAGING — US US BREAST*L* LIMITED INC AXILLA
1 series · 6 of 6 positions shown · non-contrast
Comparison: none

[Series 1: us breast*left* limited inc axilla · 0.08mm/px · 6 of 6 slices shown]
[im 1/6]
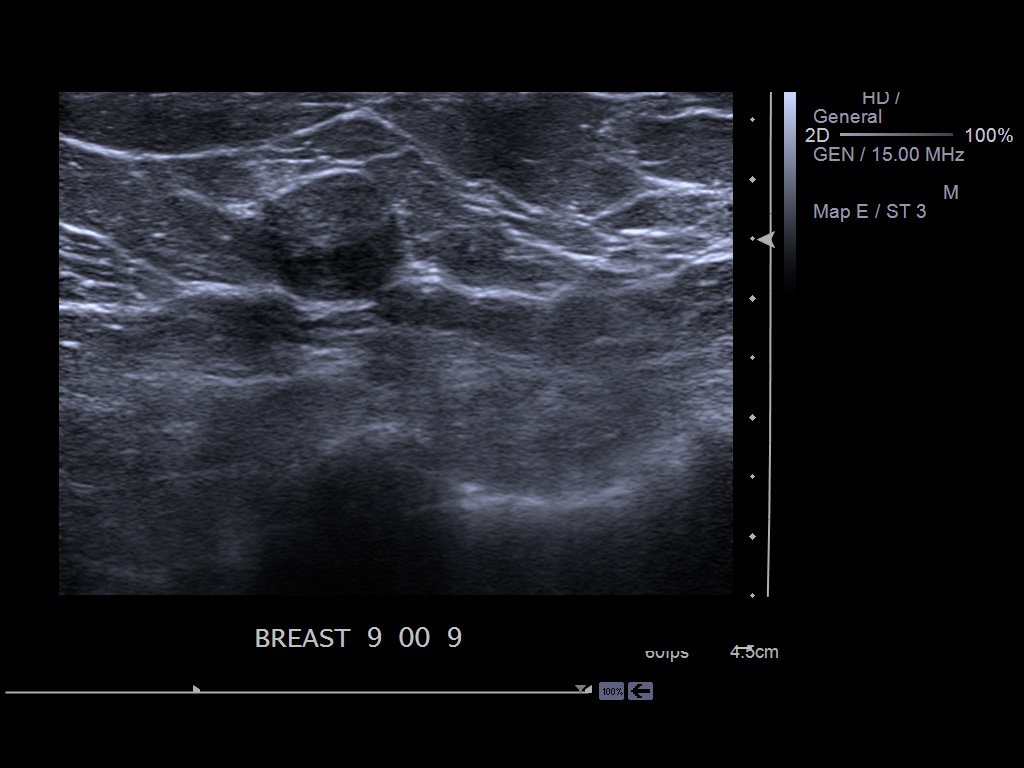
[im 2/6]
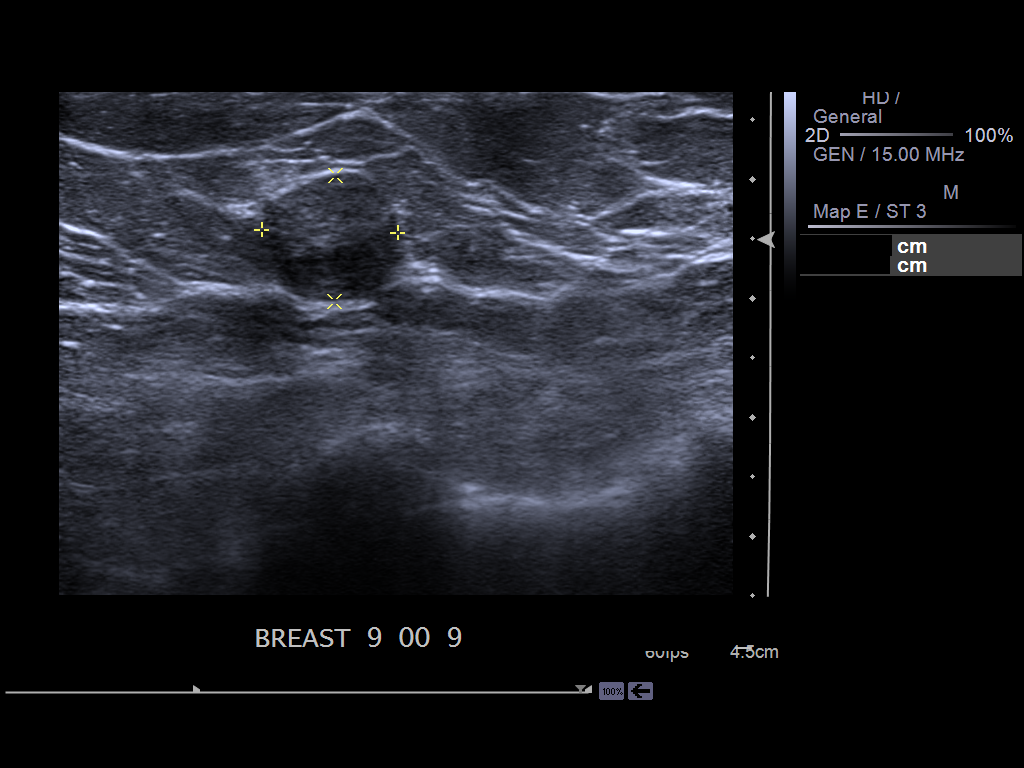
[im 3/6]
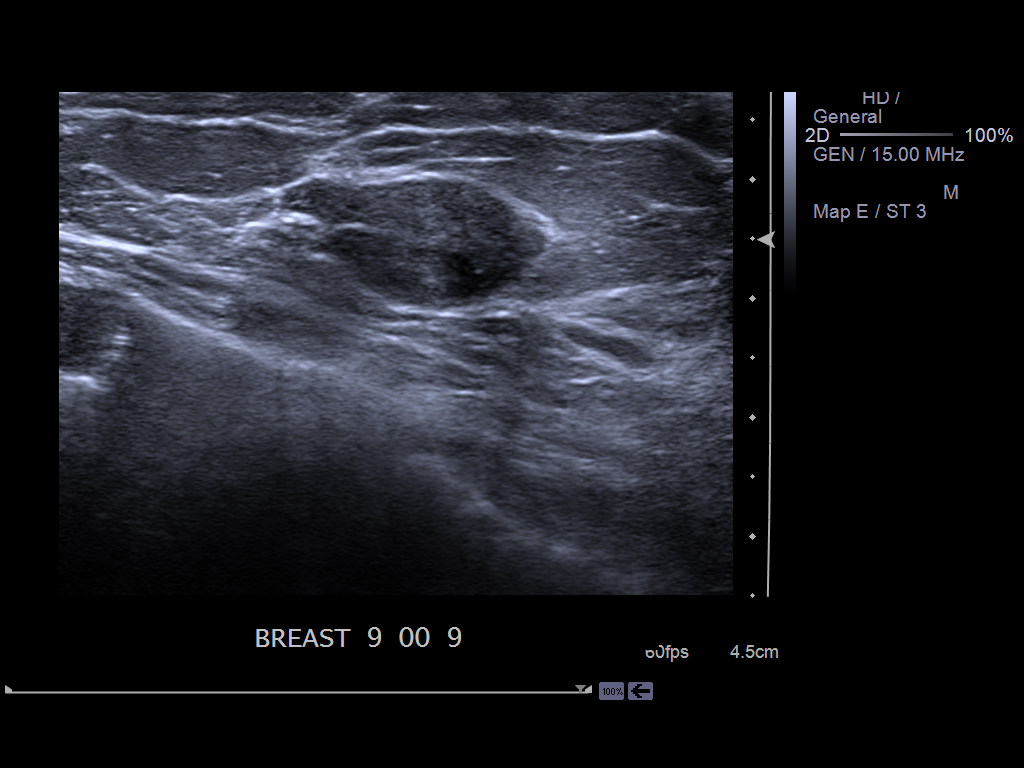
[im 4/6]
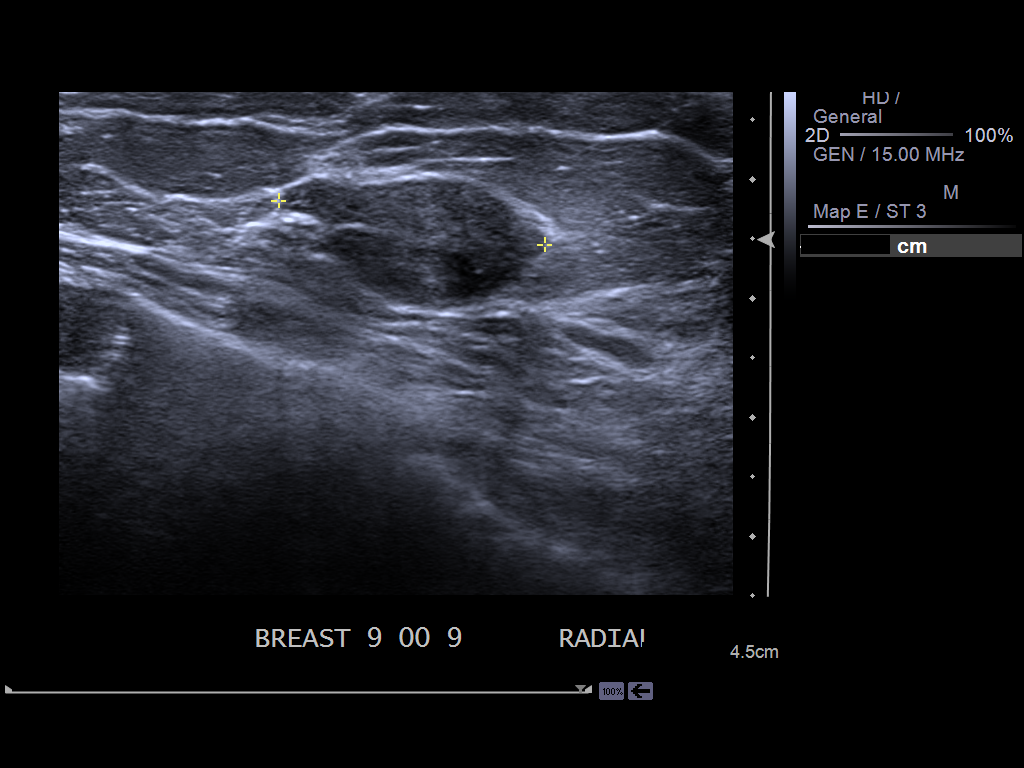
[im 5/6]
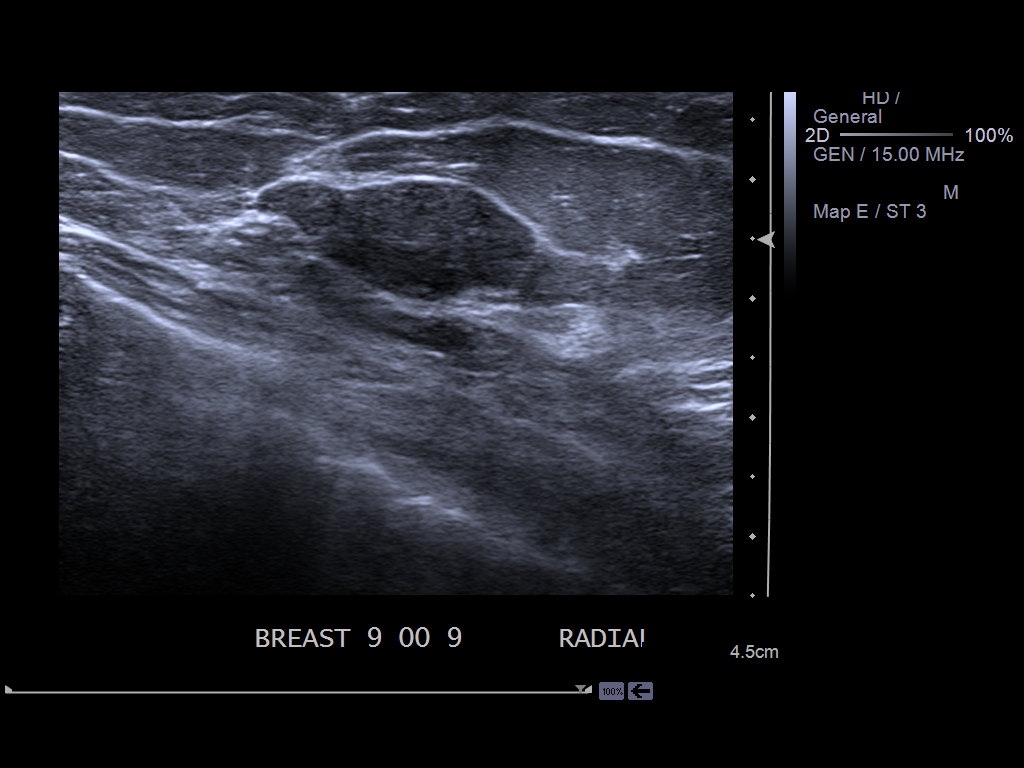
[im 6/6]
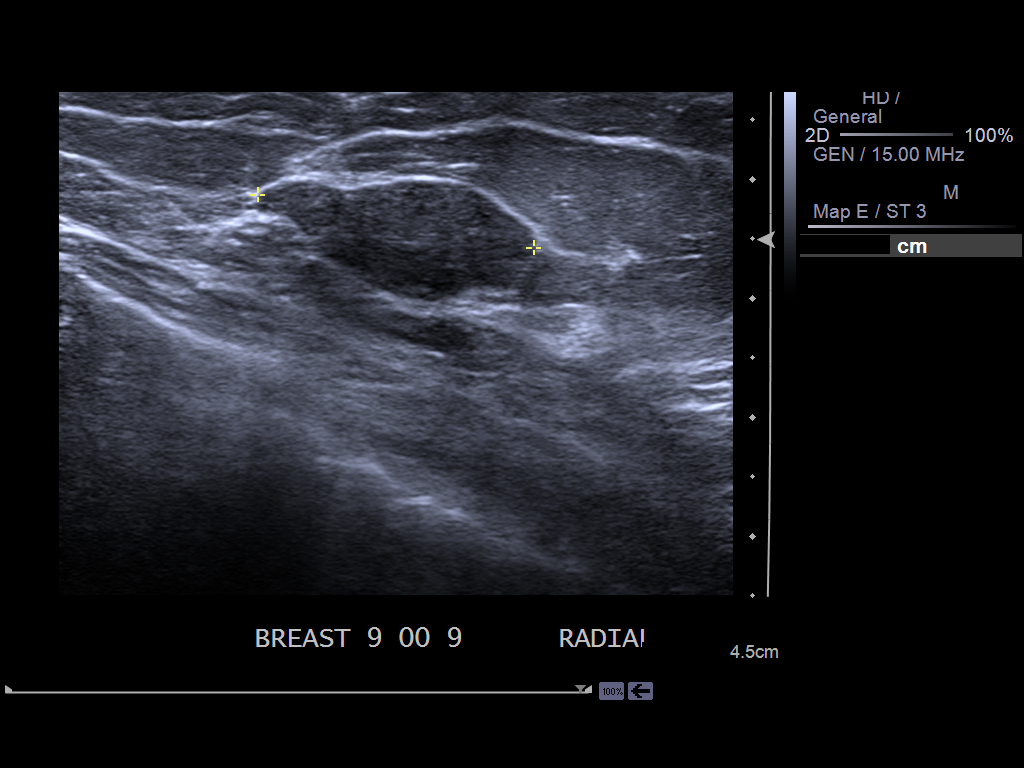

[6 of 6 positions shown; findings below may reference images not displayed]

CLINICAL DATA
Followup left breast probable fibroadenoma. This was biopsied in
[8G] with pathology results of a fibroadenoma according to the
patient. This was evaluated 1 year ago due to interval enlargement.

EXAM
DIGITAL DIAGNOSTIC  BILATERAL MAMMOGRAM WITH CAD

ULTRASOUND LEFT BREAST

COMPARISON
Previous examinations.

ACR BREAST DENSITY
ACR Breast Density Category c: The breast tissue is heterogeneously
dense, which may obscure small masses.

FINDINGS
Stable nodular densities in both breasts, including the probable
fibroadenoma in the medial left breast. No new findings suspicious
for malignancy.

Mammographic images were processed with CAD.

On physical exam, there is an approximately 1.5 x 1.0 cm oval,
palpable mass in the 9 o'clock position of the left breast, 9 cm
from the nipple.

Ultrasound is performed, showing a 2.4 x 1.1 x 1.1 cm oval,
horizontally oriented, circumscribed, hypoechoic mass in the 9
o'clock position of the left breast, 9 cm from the nipple. This
contains a thin internal septation.

IMPRESSION
Stable left breast probable fibroadenoma.

RECOMMENDATION
Bilateral diagnostic mammogram and left breast ultrasound in 1 year
to document 2 year stability

I have discussed the findings and recommendations with the patient.
Results were also provided in writing at the conclusion of the
visit. If applicable, a reminder letter will be sent to the patient
regarding the next appointment.

BI-RADS CATEGORY
3: Probably benign finding(s) - short interval follow-up suggested.

SIGNATURE

## 2015-02-11 ENCOUNTER — Other Ambulatory Visit: Payer: Self-pay | Admitting: Nurse Practitioner

## 2015-02-12 ENCOUNTER — Other Ambulatory Visit: Payer: Self-pay | Admitting: Nurse Practitioner

## 2015-02-12 DIAGNOSIS — Z1231 Encounter for screening mammogram for malignant neoplasm of breast: Secondary | ICD-10-CM

## 2015-02-12 DIAGNOSIS — N63 Unspecified lump in unspecified breast: Secondary | ICD-10-CM

## 2015-02-19 ENCOUNTER — Ambulatory Visit
Admission: RE | Admit: 2015-02-19 | Discharge: 2015-02-19 | Disposition: A | Discharge status: Home / Self Care | Payer: 59 | Source: Ambulatory Visit | Attending: Nurse Practitioner | Admitting: Nurse Practitioner

## 2015-02-19 ENCOUNTER — Ambulatory Visit: Payer: Self-pay

## 2015-02-19 ENCOUNTER — Ambulatory Visit: Payer: 59

## 2015-02-19 DIAGNOSIS — N63 Unspecified lump in unspecified breast: Secondary | ICD-10-CM

## 2015-02-19 DIAGNOSIS — Z1231 Encounter for screening mammogram for malignant neoplasm of breast: Secondary | ICD-10-CM

## 2015-02-19 IMAGING — MG MM DIAG BREAST TOMO BILATERAL
11 of 17 series · 11 of 33 positions shown · non-contrast
Comparison: Previous exams.

CLINICAL DATA: Follow-up for a probably benign mass in the medial
left breast.

EXAM:
DIGITAL DIAGNOSTIC BILATERAL MAMMOGRAM WITH 3D TOMOSYNTHESIS AND CAD

[R MLO (1 of 2)]
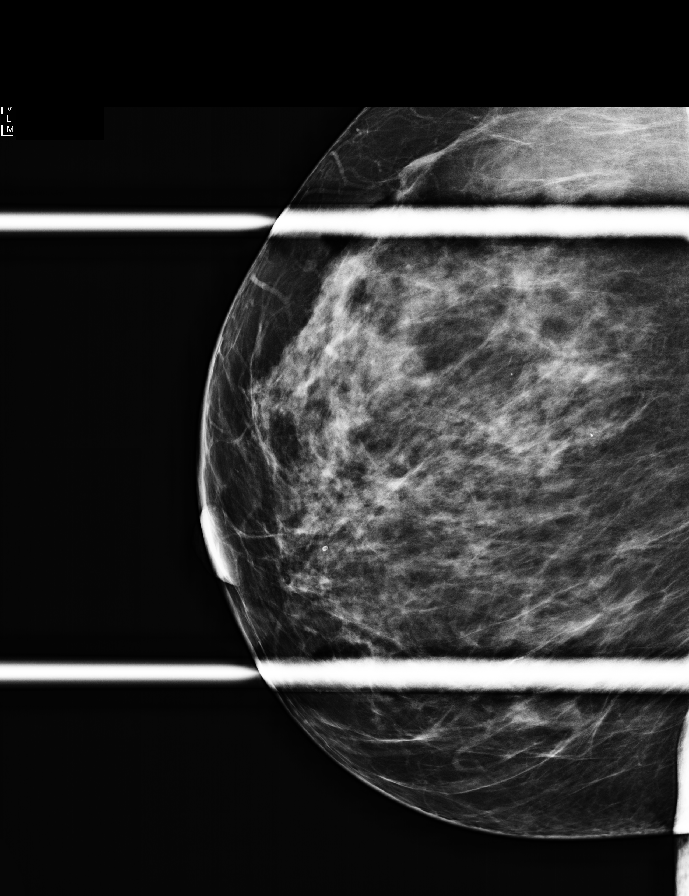

[R MLO (2 of 2)]
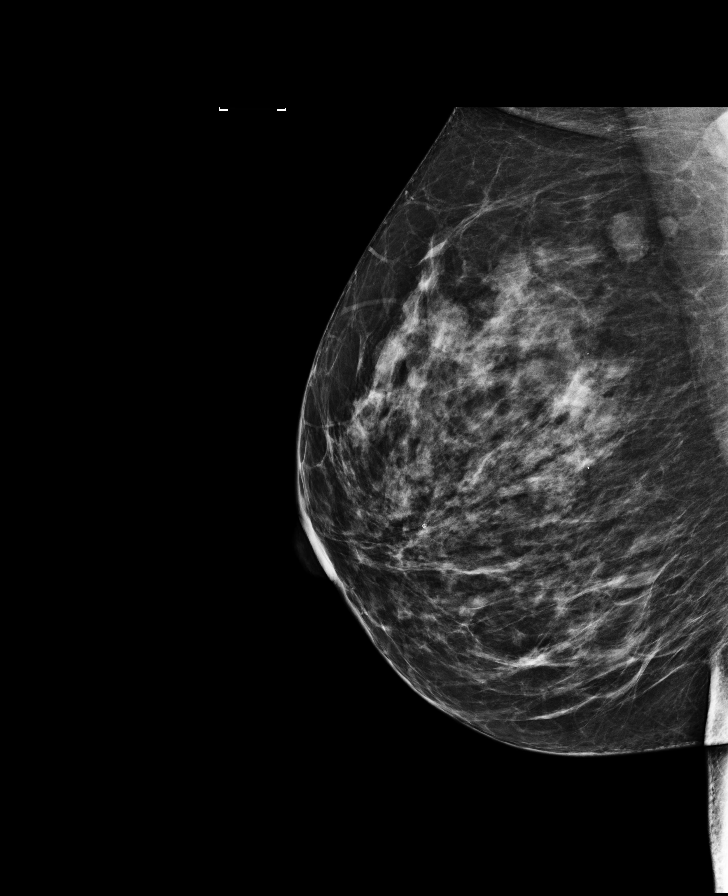

[R CC synth-2D]
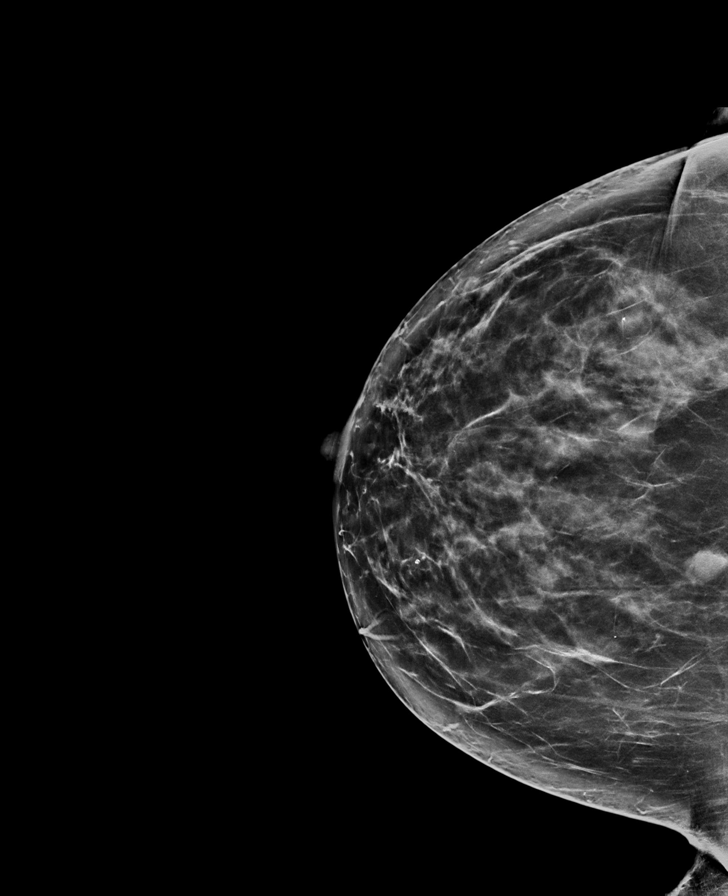

[R MLO synth-2D]
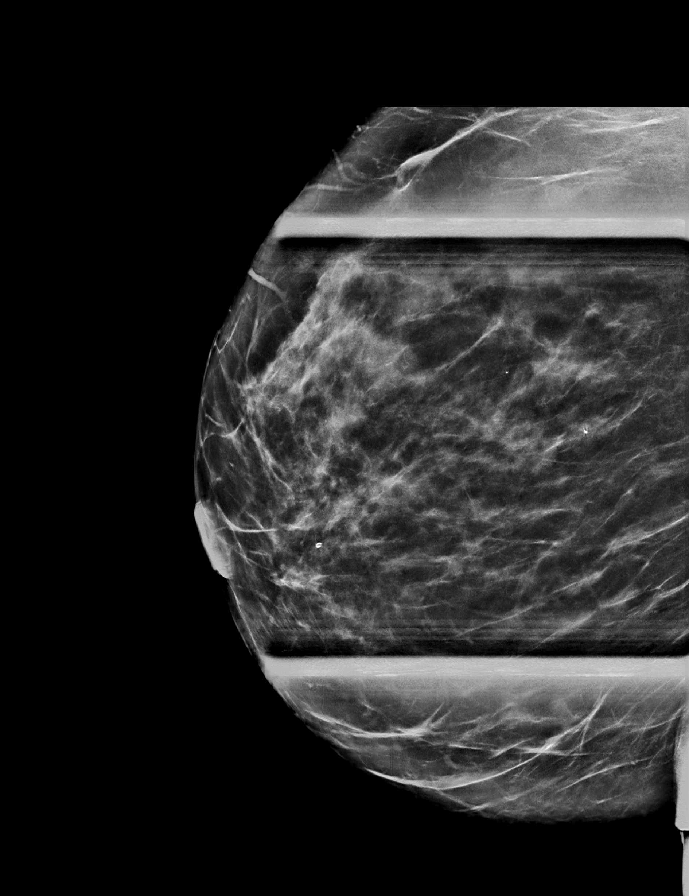

[L CC synth-2D]
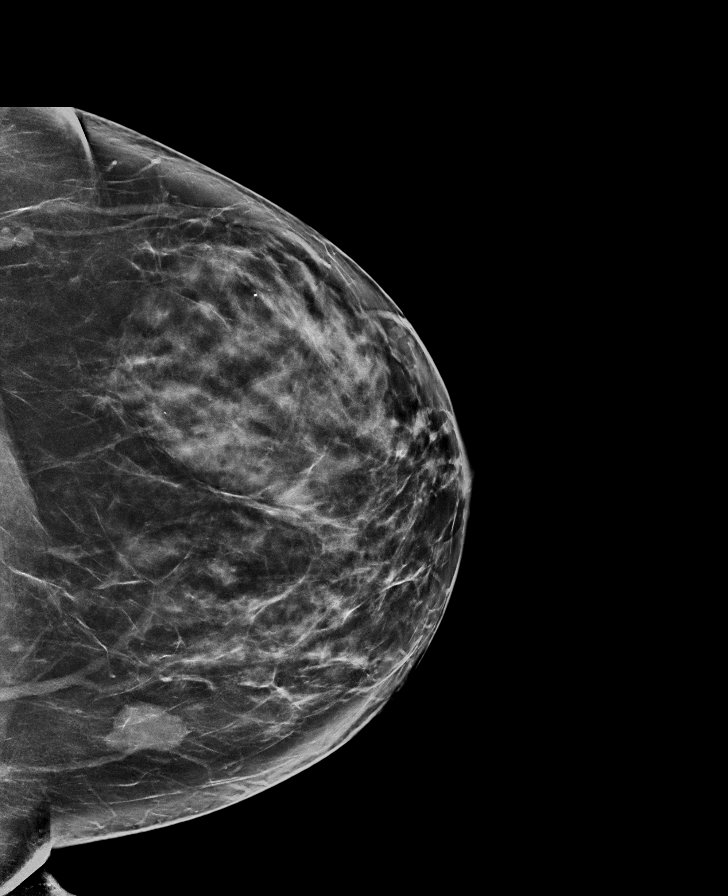

[R CC]
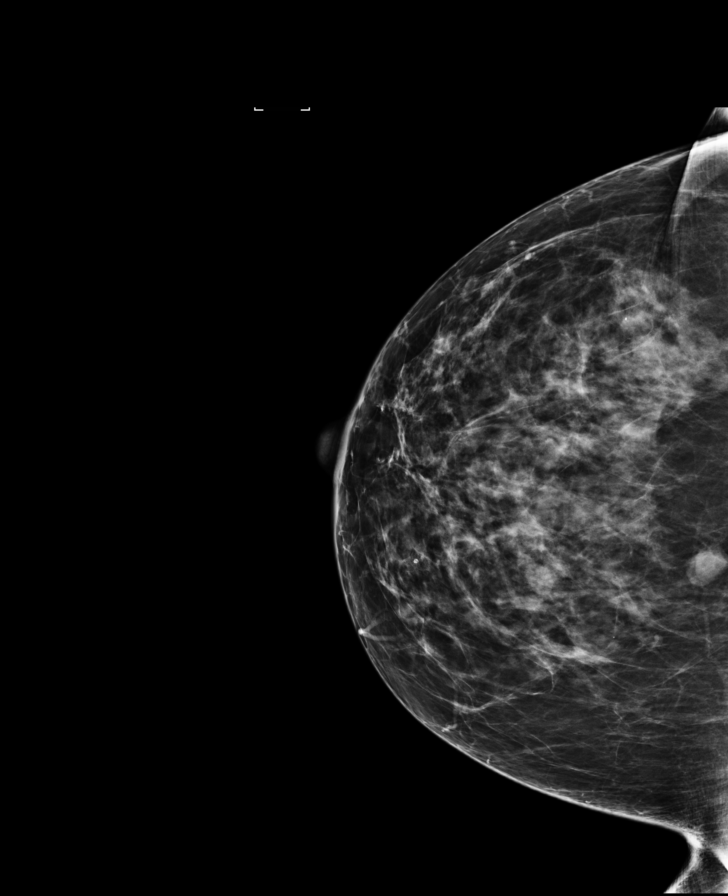

[L CC]
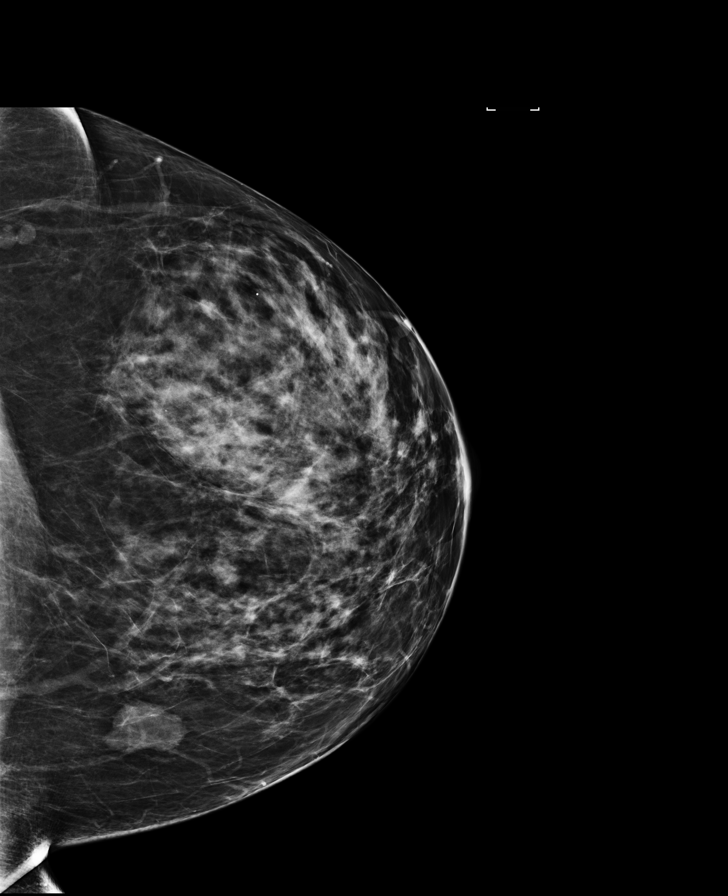

[R MLO tomo (1 of 2)]
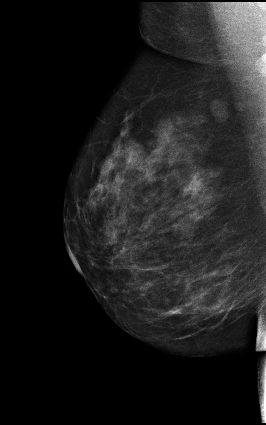

[R MLO tomo (2 of 2)]
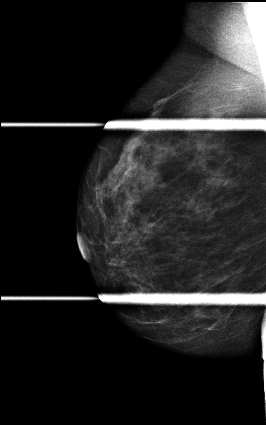

[L CC tomo]
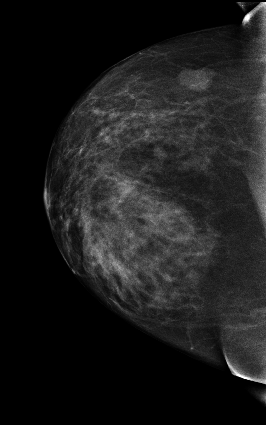

[R CC tomo]
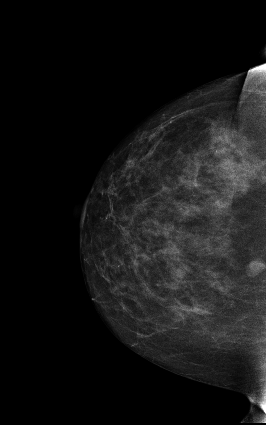

[11 of 33 positions shown; findings below may reference images not displayed]

ACR Breast Density Category c: The breast tissue is heterogeneously
dense, which may obscure small masses.
FINDINGS: No suspicious masses or calcifications are seen in either breast.
Bilateral oval and round circumscribed masses are again seen
compatible with cysts and or fibroadenomas. The oval circumscribed
probably benign mass in the far medial left breast is slightly
decreased in size when compared to the prior exams and is therefore
considered benign. An initially questioned asymmetry seen in the
upper right breast on the MLO view resolves on the additional spot
compression MLO tomosynthesis compatible with overlapping tissue.
There is no mammographic evidence of malignancy in either breast.

Mammographic images were processed with CAD.
IMPRESSION: 1. Benign mass in the far medial left breast, which is demonstrated
greater than 2 years of stability and has decreased in size in the
interval.

2.  No mammographic evidence of malignancy in either breast.

RECOMMENDATION:
Screening mammogram in one year.(Code:[JA])

I have discussed the findings and recommendations with the patient.
Results were also provided in writing at the conclusion of the
visit. If applicable, a reminder letter will be sent to the patient
regarding the next appointment.

BI-RADS CATEGORY  2: Benign.

## 2016-01-13 ENCOUNTER — Other Ambulatory Visit: Payer: Self-pay | Admitting: Obstetrics and Gynecology

## 2016-01-13 ENCOUNTER — Encounter: Payer: Self-pay | Admitting: Obstetrics and Gynecology

## 2016-01-13 ENCOUNTER — Ambulatory Visit (INDEPENDENT_AMBULATORY_CARE_PROVIDER_SITE_OTHER): Payer: 59 | Admitting: Obstetrics and Gynecology

## 2016-01-13 VITALS — BP 128/76 | HR 75 | Ht 61.0 in | Wt 196.7 lb

## 2016-01-13 DIAGNOSIS — N921 Excessive and frequent menstruation with irregular cycle: Secondary | ICD-10-CM | POA: Diagnosis not present

## 2016-01-13 DIAGNOSIS — E669 Obesity, unspecified: Secondary | ICD-10-CM

## 2016-01-13 DIAGNOSIS — Z01419 Encounter for gynecological examination (general) (routine) without abnormal findings: Secondary | ICD-10-CM

## 2016-01-13 MED ORDER — DESOGESTREL-ETHINYL ESTRADIOL 0.15-0.02/0.01 MG (21/5) PO TABS: 1.0000 | ORAL_TABLET | Freq: Every day | ORAL | Status: DC

## 2016-01-13 NOTE — Patient Instructions (Signed)
  Place annual gynecologic exam patient instructions here.  Thank you for enrolling in [facility]. Please follow the instructions below to securely access your online medical record. MyChart allows you to send messages to your doctor, view your test results, manage appointments, and more.   How Do I Sign Up? 1. In your Internet browser, go to [employer] and enter [email or web address]. 2. Click on the Sign Up Now link in the Sign In box. You will see the New Member Sign Up page. 3. Enter your MyChart Access Code exactly as it appears below. You will not need to use this code after you've completed the sign-up process. If you do not sign up before the expiration date, you must request a new code.  MyChart Access Code: [id number] Expires: 02/02/2016  9:43 AM  4. Enter your Social Security Number ([id number]) and Date of Birth (mm/dd/yyyy) as indicated and click Submit. You will be taken to the next sign-up page. 5. Create a MyChart ID. This will be your MyChart login ID and cannot be changed, so think of one that is secure and easy to remember. 6. Create a MyChart password. You can change your password at any time. 7. Enter your Password Reset Question and Answer. This can be used at a later time if you forget your password.  8. Enter your e-mail address. You will receive e-mail notification when new information is available in [place]. 9. Click Sign Up. You can now view your medical record.   Additional Information Remember, MyChart is NOT to be used for urgent needs. For medical emergencies, dial 911.

## 2016-01-13 NOTE — Progress Notes (Signed)
Subjective:   Erin Howard is a 50 y.o. [id number] Caucasian female here for a routine well-woman exam.  Patient's last menstrual period was 12/20/2015.    Current complaints: heavy and irregular menses since stopped OCPs 2 years ago- has stopped smoking and desires restart PCP: [facility]       Does need & desire labs  Social History: Sexual: heterosexual Marital Status: married Living situation: with spouse Occupation: Agricultural consultant Tobacco/alcohol: no tobacco use Illicit drugs: no history of illicit drug use  The following portions of the patient's history were reviewed and updated as appropriate: allergies, current medications, past family history, past medical history, past social history, past surgical history and problem list.  Past Medical History Past Medical History  Diagnosis Date  . Heavy periods   . Irregular periods     Past Surgical History Past Surgical History  Procedure Laterality Date  . Breast biopsy Left 12/03/2013    neg  . Appendectomy    . Gallbladder surgery      Gynecologic History [id number]  Patient's last menstrual period was 12/20/2015. Contraception: condoms Last Pap: 2015. Results were: normal Last mammogram: 2016. Results were: category 3 density, normal   Obstetric History OB History  Gravida Para Term Preterm AB SAB TAB Ectopic Multiple Living  4    1  1   3     # Outcome Date GA Lbr Len/2nd Weight Sex Delivery Anes PTL Lv  4 Gravida 1997    F Vag-Spont   Y  3 TAB 1992          2 Gravida 1991    M Vag-Spont   Y  1 Gravida 1990    F Vag-Spont   Y      Current Medications No current outpatient prescriptions on file prior to visit.   No current facility-administered medications on file prior to visit.    Review of Systems Patient denies any headaches, blurred vision, shortness of breath, chest pain, abdominal pain, problems with bowel movements, urination, or intercourse.  Objective:  BP 128/76 mmHg  Pulse 75  Ht 5\' 1"  (1.549  m)  Wt 196 lb 11.2 oz (89.223 kg)  BMI 37.19 kg/m2  LMP 12/20/2015 Physical Exam  General:  Well developed, well nourished, no acute distress. She is alert and oriented x3. Skin:  Warm and dry Neck:  Midline trachea, no thyromegaly or nodules Cardiovascular: Regular rate and rhythm, no murmur heard Lungs:  Effort normal, all lung fields clear to auscultation bilaterally Breasts:  No dominant palpable mass, retraction, or nipple discharge Abdomen:  Soft, non tender, no hepatosplenomegaly or masses Pelvic:  External genitalia is normal in appearance.  The vagina is normal in appearance. The cervix is bulbous, no CMT.  Thin prep pap is done with HR HPV cotesting. Uterus is felt to be normal size, shape, and contour.  No adnexal masses or tenderness noted. Extremities:  No swelling or varicosities noted Psych:  She has a normal mood and affect  Assessment:   Healthy well-woman exam Menorrhagia obesity  Plan:  Labs obtained, encouraged weight loss F/U 1 year for AE, or sooner if needed Mammogram Scheduled Restart OCPs- on 4th day of next menses  Melody Rockney [place], CNM

## 2016-01-14 LAB — COMPREHENSIVE METABOLIC PANEL
ALBUMIN: 4.4 g/dL (ref 3.5–5.5)
ALK PHOS: 59 IU/L (ref 39–117)
ALT: 29 IU/L (ref 0–32)
AST: 17 IU/L (ref 0–40)
Albumin/Globulin Ratio: 1.6 (ref 1.2–2.2)
BILIRUBIN TOTAL: 0.3 mg/dL (ref 0.0–1.2)
BUN / CREAT RATIO: 12 (ref 9–23)
BUN: 8 mg/dL (ref 6–24)
CHLORIDE: 100 mmol/L (ref 96–106)
CO2: 19 mmol/L (ref 18–29)
Calcium: 10 mg/dL (ref 8.7–10.2)
Creatinine, Ser: 0.69 mg/dL (ref 0.57–1.00)
GFR calc Af Amer: 119 mL/min/{1.73_m2} (ref >59)
GFR calc non Af Amer: 103 mL/min/{1.73_m2} (ref >59)
GLUCOSE: 88 mg/dL (ref 65–99)
Globulin, Total: 2.7 g/dL (ref 1.5–4.5)
Potassium: 4.4 mmol/L (ref 3.5–5.2)
Sodium: 139 mmol/L (ref 134–144)
Total Protein: 7.1 g/dL (ref 6.0–8.5)

## 2016-01-14 LAB — THYROID PANEL WITH TSH
Free Thyroxine Index: 2.5 (ref 1.2–4.9)
T3 Uptake Ratio: 25 % (ref 24–39)
T4, Total: 9.8 ug/dL (ref 4.5–12.0)
TSH: 1.37 u[IU]/mL (ref 0.450–4.500)

## 2016-01-14 LAB — CBC
HEMATOCRIT: 42.2 % (ref 34.0–46.6)
HEMOGLOBIN: 14.2 g/dL (ref 11.1–15.9)
MCH: 29.5 pg (ref 26.6–33.0)
MCHC: 33.6 g/dL (ref 31.5–35.7)
MCV: 88 fL (ref 79–97)
Platelets: 340 10*3/uL (ref 150–379)
RBC: 4.82 x10E6/uL (ref 3.77–5.28)
RDW: 13.4 % (ref 12.3–15.4)
WBC: 8.9 10*3/uL (ref 3.4–10.8)

## 2016-01-14 LAB — HEMOGLOBIN A1C
ESTIMATED AVERAGE GLUCOSE: 134 mg/dL
Hgb A1c MFr Bld: 6.3 % — ABNORMAL HIGH (ref 4.8–5.6)

## 2016-01-14 LAB — LIPID PANEL
CHOLESTEROL TOTAL: 208 mg/dL — AB (ref 100–199)
Chol/HDL Ratio: 4 ratio units (ref 0.0–4.4)
HDL: 52 mg/dL (ref >39)
LDL Calculated: 129 mg/dL — ABNORMAL HIGH (ref 0–99)
TRIGLYCERIDES: 137 mg/dL (ref 0–149)
VLDL CHOLESTEROL CAL: 27 mg/dL (ref 5–40)

## 2016-01-14 LAB — CYTOLOGY - PAP

## 2016-01-14 LAB — VITAMIN D 25 HYDROXY (VIT D DEFICIENCY, FRACTURES): VIT D 25 HYDROXY: 27.4 ng/mL — AB (ref 30.0–100.0)

## 2016-01-15 ENCOUNTER — Other Ambulatory Visit: Payer: Self-pay | Admitting: Obstetrics and Gynecology

## 2016-01-15 DIAGNOSIS — E7800 Pure hypercholesterolemia, unspecified: Secondary | ICD-10-CM | POA: Insufficient documentation

## 2016-01-15 DIAGNOSIS — E559 Vitamin D deficiency, unspecified: Secondary | ICD-10-CM | POA: Insufficient documentation

## 2016-01-15 DIAGNOSIS — R7303 Prediabetes: Secondary | ICD-10-CM | POA: Insufficient documentation

## 2016-01-15 MED ORDER — VITAMIN D (ERGOCALCIFEROL) 1.25 MG (50000 UNIT) PO CAPS: 50000.0000 [IU] | ORAL_CAPSULE | ORAL | Status: DC

## 2016-01-19 ENCOUNTER — Telehealth: Payer: Self-pay | Admitting: *Deleted

## 2016-01-19 NOTE — Telephone Encounter (Signed)
-----   Message from Joylene Igo, [place] sent at 01/15/2016  3:19 PM EDT ----- Please let her know lab results and mail info on vit D def and pre-diabetes, needs to work hard on low chol & low carb diet, exercise, wt loss. Want to recheck labs in 3 months. If HgA1c doesn't come down (1 point from being diabetic) she will be full blown diabetic soon.

## 2016-01-19 NOTE — Telephone Encounter (Signed)
Mailed all info to pt about her labs

## 2016-02-11 ENCOUNTER — Ambulatory Visit: Payer: 59 | Admitting: Obstetrics and Gynecology

## 2016-03-16 ENCOUNTER — Ambulatory Visit: Payer: 59 | Admitting: Obstetrics and Gynecology

## 2016-04-19 ENCOUNTER — Ambulatory Visit: Payer: 59 | Admitting: Obstetrics and Gynecology

## 2016-05-12 ENCOUNTER — Ambulatory Visit (INDEPENDENT_AMBULATORY_CARE_PROVIDER_SITE_OTHER): Payer: 59 | Admitting: Obstetrics and Gynecology

## 2016-05-12 ENCOUNTER — Encounter: Payer: Self-pay | Admitting: Obstetrics and Gynecology

## 2016-05-12 VITALS — BP 130/91 | HR 82 | Ht 60.0 in | Wt 186.0 lb

## 2016-05-12 DIAGNOSIS — E559 Vitamin D deficiency, unspecified: Secondary | ICD-10-CM | POA: Diagnosis not present

## 2016-05-12 DIAGNOSIS — E669 Obesity, unspecified: Secondary | ICD-10-CM

## 2016-05-12 MED ORDER — PHENTERMINE HCL 37.5 MG PO TABS
37.5000 mg | ORAL_TABLET | Freq: Every day | ORAL | 2 refills | Status: DC
Start: 2016-05-12 — End: 2016-10-06

## 2016-05-12 MED ORDER — CYANOCOBALAMIN 1000 MCG/ML IJ SOLN: 1000.0000 ug | INTRAMUSCULAR | 1 refills | Status: DC

## 2016-05-12 NOTE — Progress Notes (Signed)
Subjective:  Erin Howard is a 50 y.o. [phone] at Unknown being seen today for weight loss management- initial visit.  Patient reports General ROS: negative and reports previous weight loss attempts:    The following portions of the patient's history were reviewed and updated as appropriate: allergies, current medications, past family history, past medical history, past social history, past surgical history and problem list.   Objective:   Vitals:   05/12/16 1559  BP: (!) 130/91  Pulse: 82  Weight: 186 lb (84.4 kg)  Height: 5' (1.524 m)    General:  Alert, oriented and cooperative. Patient is in no acute distress.  :   :   :   :   :   :   PE: Well groomed female in no current distress,   Mental Status: Normal mood and affect. Normal behavior. Normal judgment and thought content.   Current BMI: Body mass index is 36.33 kg/m.   Assessment and Plan:  Obesity  1. Obesity Will start weight loss program here. - Hemoglobin A1c  2. Vitamin D deficiency Repeat labs - Vitamin D (25 hydroxy)   Plan: low carb, High protein diet RX for adipex 37.5 mg daily and B12 1051mcg.ml monthly, to start now with first injection given at today's visit. Reviewed side-effects common to both medications and expected outcomes. Increase daily water intake to at least 8 bottle a day, every day.  Goal is to reduse weight by 10% by end of three months, and will re-evaluate then.  RTC in 4 weeks for Nurse visit to check weight & BP, and get next B12 injections.    Please refer to After Visit Summary for other counseling recommendations.    Melody N Schneider, CNM   Melody [employer], CNM      Consider the Low Glycemic Index Diet and 6 smaller meals daily .  This boosts your metabolism and regulates your sugars:   Use the protein bar by [employer]kins because they have lots of fiber in them  Find the low carb flatbreads, tortillas and pita breads for sandwiches:  Joseph's makes a pita  bread and a flat bread , available at [facility] and [employer]; [place] makes a low carb flatbread available at [employer] and HT that is 9 net carbs and 100 cal [place] makes a low carb whole wheat tortilla available at [employer] most grocery stores with 6 net carbs and 210 cal  [place] yogurt can still have a lot of carbs .  Dannon Light N fit has 80 cal and 8 carbs

## 2016-05-13 LAB — HEMOGLOBIN A1C
Est. average glucose Bld gHb Est-mCnc: 123 mg/dL
HEMOGLOBIN A1C: 5.9 % — AB (ref 4.8–5.6)

## 2016-05-13 LAB — VITAMIN D 25 HYDROXY (VIT D DEFICIENCY, FRACTURES): VIT D 25 HYDROXY: 55.1 ng/mL (ref 30.0–100.0)

## 2016-06-09 ENCOUNTER — Ambulatory Visit: Payer: 59

## 2016-06-13 ENCOUNTER — Ambulatory Visit (INDEPENDENT_AMBULATORY_CARE_PROVIDER_SITE_OTHER): Payer: 59 | Admitting: Obstetrics and Gynecology

## 2016-06-13 VITALS — BP 124/90 | HR 86

## 2016-06-13 DIAGNOSIS — E669 Obesity, unspecified: Secondary | ICD-10-CM | POA: Diagnosis not present

## 2016-06-13 MED ORDER — CYANOCOBALAMIN 1000 MCG/ML IJ SOLN
1000.0000 ug | Freq: Once | INTRAMUSCULAR | Status: AC
  Administered 2016-06-13: 1000 ug via INTRAMUSCULAR

## 2016-06-13 NOTE — Progress Notes (Signed)
Patient ID: Erin Howard, female   DOB: 03/19/1966, 50 y.o.   MRN: [id number] Pt presents for weight, B/P, B-12 injection. No side effects of medication-Phentermine, or B-12.  Weight loss/gain of  8 lbs. Encouraged eating healthy and exercise.

## 2016-07-11 ENCOUNTER — Ambulatory Visit (INDEPENDENT_AMBULATORY_CARE_PROVIDER_SITE_OTHER): Payer: 59 | Admitting: Obstetrics and Gynecology

## 2016-07-11 VITALS — BP 123/87 | HR 94 | Ht 65.0 in | Wt 174.0 lb

## 2016-07-11 DIAGNOSIS — E669 Obesity, unspecified: Secondary | ICD-10-CM | POA: Diagnosis not present

## 2016-07-11 MED ORDER — CYANOCOBALAMIN 1000 MCG/ML IJ SOLN: 1000.0000 ug | INTRAMUSCULAR | 0 refills | Status: DC

## 2016-07-11 MED ORDER — CYANOCOBALAMIN 1000 MCG/ML IJ SOLN
1000.0000 ug | Freq: Once | INTRAMUSCULAR | Status: AC
  Administered 2016-07-11: 1000 ug via INTRAMUSCULAR

## 2016-07-11 NOTE — Progress Notes (Signed)
Patient ID: Erin Howard, female   DOB: 10/10/1965, 50 y.o.   MRN: [id number]  Pt presents for weight, B/P, B-12 injection. No side effects of medication-Phentermine, or B-12.  Weight loss of 12 lbs. Since August.  Encouraged eating healthy and exercise.

## 2016-08-02 ENCOUNTER — Other Ambulatory Visit: Payer: Self-pay | Admitting: Obstetrics and Gynecology

## 2016-08-10 ENCOUNTER — Encounter: Payer: 59 | Admitting: Obstetrics and Gynecology

## 2016-08-31 ENCOUNTER — Telehealth: Payer: Self-pay | Admitting: Obstetrics and Gynecology

## 2016-08-31 NOTE — Telephone Encounter (Signed)
Patient needs script refilled at her new pharmacy for a year of refills. She had a years worth of refills at her old pharmacy but they closed down unexpectedly so she thinks that her refills got messed up. She currently uses the [facility] in [place].Thanks

## 2016-09-01 ENCOUNTER — Encounter: Payer: 59 | Admitting: Obstetrics and Gynecology

## 2016-09-06 ENCOUNTER — Other Ambulatory Visit: Payer: Self-pay

## 2016-09-06 MED ORDER — DESOGESTREL-ETHINYL ESTRADIOL 0.15-0.02/0.01 MG (21/5) PO TABS: 1.0000 | ORAL_TABLET | Freq: Every day | ORAL | 5 refills | Status: DC

## 2016-09-16 ENCOUNTER — Encounter: Payer: 59 | Admitting: Obstetrics and Gynecology

## 2016-09-16 NOTE — Telephone Encounter (Signed)
Pt is coming in 09/21/16 will discuss with her @ visit

## 2016-10-06 ENCOUNTER — Encounter: Payer: Self-pay | Admitting: Obstetrics and Gynecology

## 2016-10-06 ENCOUNTER — Ambulatory Visit (INDEPENDENT_AMBULATORY_CARE_PROVIDER_SITE_OTHER): Payer: 59 | Admitting: Obstetrics and Gynecology

## 2016-10-06 VITALS — BP 132/84 | HR 85 | Ht 61.0 in | Wt 179.6 lb

## 2016-10-06 DIAGNOSIS — E663 Overweight: Secondary | ICD-10-CM | POA: Diagnosis not present

## 2016-10-06 DIAGNOSIS — E669 Obesity, unspecified: Secondary | ICD-10-CM

## 2016-10-06 DIAGNOSIS — E66811 Obesity, class 1: Secondary | ICD-10-CM

## 2016-10-06 MED ORDER — PHENTERMINE HCL 37.5 MG PO TABS: 37.5000 mg | ORAL_TABLET | Freq: Every day | ORAL | 2 refills | Status: DC

## 2016-10-06 NOTE — Progress Notes (Signed)
SUBJECTIVE:  51 y.o. here for follow-up weight loss visit, previously seen 4 weeks ago. Denies any concerns and feels like medication has worked well, but has been off since October. Desires restart.  OBJECTIVE:  BP 132/84   Pulse 85   Ht 5\' 1"  (1.549 m)   Wt 179 lb 9.6 oz (81.5 kg)   LMP 09/28/2016   BMI 33.94 kg/m   Body mass index is 33.94 kg/m. Patient appears well. ASSESSMENT:  Obesity- responding well to weight loss plan PLAN:  To continue with current medications. B12 1029mcg/ml injection given RTC in 4 weeks as planned  Danette Weinfeld [place], CNM

## 2016-11-03 ENCOUNTER — Other Ambulatory Visit: Payer: Self-pay | Admitting: *Deleted

## 2016-11-03 ENCOUNTER — Ambulatory Visit (INDEPENDENT_AMBULATORY_CARE_PROVIDER_SITE_OTHER): Payer: 59 | Admitting: Obstetrics and Gynecology

## 2016-11-03 ENCOUNTER — Encounter: Payer: Self-pay | Admitting: Obstetrics and Gynecology

## 2016-11-03 VITALS — BP 127/82 | HR 98 | Wt 178.0 lb

## 2016-11-03 DIAGNOSIS — E663 Overweight: Secondary | ICD-10-CM

## 2016-11-03 MED ORDER — MOXIFLOXACIN HCL 0.5 % OP SOLN: 1.0000 [drp] | Freq: Three times a day (TID) | OPHTHALMIC | 0 refills | Status: DC

## 2016-11-03 MED ORDER — CYANOCOBALAMIN 1000 MCG/ML IJ SOLN: 1000.0000 ug | Freq: Once | INTRAMUSCULAR | Status: AC

## 2016-11-03 NOTE — Progress Notes (Signed)
Pt is here for wt, bp check, b-12, she is doing well, denies any s/e  07/11/17 wt- 174lb 10/06/16 wt- 179lb

## 2016-11-29 ENCOUNTER — Ambulatory Visit (INDEPENDENT_AMBULATORY_CARE_PROVIDER_SITE_OTHER): Payer: 59 | Admitting: Obstetrics and Gynecology

## 2016-11-29 VITALS — BP 133/84 | HR 87 | Wt 176.3 lb

## 2016-11-29 DIAGNOSIS — E663 Overweight: Secondary | ICD-10-CM | POA: Diagnosis not present

## 2016-11-29 MED ORDER — CYANOCOBALAMIN 1000 MCG/ML IJ SOLN
1000.0000 ug | Freq: Once | INTRAMUSCULAR | Status: AC
  Administered 2016-11-29: 1000 ug via INTRAMUSCULAR

## 2016-11-29 NOTE — Progress Notes (Signed)
Pt is here for wt, bp check, b-12 inj, pt is doing well, denies any s/e   11/29/16 wt- 176.3lb 11/03/16 wt- 178lb 10/06/16 wt- 179

## 2016-11-30 ENCOUNTER — Other Ambulatory Visit: Payer: Self-pay | Admitting: *Deleted

## 2016-11-30 ENCOUNTER — Ambulatory Visit: Payer: 59

## 2016-11-30 MED ORDER — CYANOCOBALAMIN 1000 MCG/ML IJ SOLN: 1000.0000 ug | INTRAMUSCULAR | 3 refills | Status: DC

## 2016-12-27 ENCOUNTER — Encounter: Payer: 59 | Admitting: Obstetrics and Gynecology

## 2017-01-16 ENCOUNTER — Other Ambulatory Visit: Payer: Self-pay | Admitting: *Deleted

## 2017-01-16 ENCOUNTER — Telehealth: Payer: Self-pay | Admitting: Obstetrics and Gynecology

## 2017-01-16 MED ORDER — DESOGESTREL-ETHINYL ESTRADIOL 0.15-0.02/0.01 MG (21/5) PO TABS: 1.0000 | ORAL_TABLET | Freq: Every day | ORAL | 2 refills | Status: DC

## 2017-01-16 NOTE — Telephone Encounter (Signed)
Patient needs [facility] refill called into [facility] please

## 2017-01-16 NOTE — Telephone Encounter (Signed)
Done-ac 

## 2017-01-17 ENCOUNTER — Encounter: Payer: 59 | Admitting: Obstetrics and Gynecology

## 2017-03-23 ENCOUNTER — Encounter: Payer: Self-pay | Admitting: Obstetrics and Gynecology

## 2017-03-23 ENCOUNTER — Ambulatory Visit (INDEPENDENT_AMBULATORY_CARE_PROVIDER_SITE_OTHER): Payer: 59 | Admitting: Obstetrics and Gynecology

## 2017-03-23 VITALS — BP 142/82 | HR 79 | Ht 61.0 in | Wt 163.2 lb

## 2017-03-23 DIAGNOSIS — Z01419 Encounter for gynecological examination (general) (routine) without abnormal findings: Secondary | ICD-10-CM | POA: Diagnosis not present

## 2017-03-23 DIAGNOSIS — E559 Vitamin D deficiency, unspecified: Secondary | ICD-10-CM

## 2017-03-23 MED ORDER — VITAMIN D (ERGOCALCIFEROL) 1.25 MG (50000 UNIT) PO CAPS: 50000.0000 [IU] | ORAL_CAPSULE | ORAL | 1 refills | Status: DC

## 2017-03-23 MED ORDER — DESOGESTREL-ETHINYL ESTRADIOL 0.15-0.02/0.01 MG (21/5) PO TABS: 1.0000 | ORAL_TABLET | Freq: Every day | ORAL | 4 refills | Status: DC

## 2017-03-23 NOTE — Progress Notes (Signed)
Subjective:   Erin Howard is a 51 y.o. G9P0013 Caucasian female here for a routine well-woman exam.  Patient's last menstrual period was 03/12/2017.    Current complaints: none, states she feels great after weight loss and is continuing to lose. PCP: Erin Howard       does desire labs  Social History: Sexual: heterosexual Marital Status: married Living situation: with family Occupation: Medical illustrator Tobacco/alcohol: no tobacco use Illicit drugs: no history of illicit drug use  The following portions of the patient's history were reviewed and updated as appropriate: allergies, current medications, past family history, past medical history, past social history, past surgical history and problem list.  Past Medical History Past Medical History:  Diagnosis Date  . Heavy periods   . Irregular periods     Past Surgical History Past Surgical History:  Procedure Laterality Date  . APPENDECTOMY    . BREAST BIOPSY Left 12/03/2013   neg  . GALLBLADDER SURGERY      Gynecologic History [phone]  Patient's last menstrual period was 03/12/2017. Contraception: OCP (estrogen/progesterone) Last Pap: 12/2015. Results were: normal Last mammogram: 2016. Results were: normal   Obstetric History OB History  Gravida Para Term Preterm AB Living  4       1 3   SAB TAB Ectopic Multiple Live Births    1     3    # Outcome Date GA Lbr Len/2nd Weight Sex Delivery Anes PTL Lv  4 Gravida 1997    F Vag-Spont   LIV  3 TAB 1992          2 Gravida 1991    M Vag-Spont   LIV  1 Gravida 1990    F Vag-Spont   LIV      Current Medications Current Outpatient Prescriptions on File Prior to Visit  Medication Sig Dispense Refill  . desogestrel-ethinyl estradiol (AZURETTE) 0.15-0.02/0.01 MG (21/5) tablet Take 1 tablet by mouth daily. 28 tablet 2  . loratadine (CLARITIN) 10 MG tablet Take 10 mg by mouth daily.    . Vitamin D, Ergocalciferol, (DRISDOL) 50000 units CAPS capsule Take 1 capsule (50,000 Units  total) by mouth every 7 (seven) days. 30 capsule 1  . cyanocobalamin (,VITAMIN B-12,) 1000 MCG/ML injection Inject 1 mL (1,000 mcg total) into the muscle every 30 (thirty) days. (Patient not taking: Reported on 03/23/2017) 10 mL 3  . phentermine (ADIPEX-P) 37.5 MG tablet Take 1 tablet (37.5 mg total) by mouth daily before breakfast. (Patient not taking: Reported on 03/23/2017) 30 tablet 2   Current Facility-Administered Medications on File Prior to Visit  Medication Dose Route Frequency Provider Last Rate Last Dose  . cyanocobalamin ((VITAMIN B-12)) injection 1,000 mcg  1,000 mcg Intramuscular Once [place], Erin Howard, CNM        Review of Systems Patient denies any headaches, blurred vision, shortness of breath, chest pain, abdominal pain, problems with bowel movements, urination, or intercourse.  Objective:  BP (!) 142/82   Pulse 79   Ht 5\' 1"  (1.549 m)   Wt 163 lb 3.2 oz (74 kg)   LMP 03/12/2017   BMI 30.84 kg/m  Physical Exam  General:  Well developed, well nourished, no acute distress. She is alert and oriented x3. Skin:  Warm and dry Neck:  Midline trachea, no thyromegaly or nodules Cardiovascular: Regular rate and rhythm, no murmur heard Lungs:  Effort normal, all lung fields clear to auscultation bilaterally Breasts:  No dominant palpable mass, retraction, or nipple discharge Abdomen:  Soft, non  tender, no hepatosplenomegaly or masses Pelvic:  External genitalia is normal in appearance.  The vagina is normal in appearance. The cervix is bulbous, no CMT.  Thin prep pap is not done. Uterus is felt to be normal size, shape, and contour.  No adnexal masses or tenderness noted. Extremities:  No swelling or varicosities noted Psych:  She has a normal mood and affect  Assessment:   Healthy well-woman exam Vit D deficiency Pre-diabetes  Plan:  Labs repeated and will follow up accordingly F/U 1 year for AE, or sooner if needed Mammogram ordered  Erin Howard, CNM

## 2017-03-23 NOTE — Patient Instructions (Signed)
Place annual gynecologic exam patient instructions here.

## 2017-03-24 ENCOUNTER — Other Ambulatory Visit: Payer: Self-pay | Admitting: Obstetrics and Gynecology

## 2017-03-24 LAB — COMPREHENSIVE METABOLIC PANEL
A/G RATIO: 1.8 (ref 1.2–2.2)
ALBUMIN: 4.4 g/dL (ref 3.5–5.5)
ALT: 17 IU/L (ref 0–32)
AST: 13 IU/L (ref 0–40)
Alkaline Phosphatase: 54 IU/L (ref 39–117)
BUN / CREAT RATIO: 20 (ref 9–23)
BUN: 12 mg/dL (ref 6–24)
Bilirubin Total: 0.3 mg/dL (ref 0.0–1.2)
CALCIUM: 9.5 mg/dL (ref 8.7–10.2)
CO2: 22 mmol/L (ref 20–29)
Chloride: 102 mmol/L (ref 96–106)
Creatinine, Ser: 0.6 mg/dL (ref 0.57–1.00)
GFR, EST AFRICAN AMERICAN: 124 mL/min/{1.73_m2} (ref >59)
GFR, EST NON AFRICAN AMERICAN: 107 mL/min/{1.73_m2} (ref >59)
GLOBULIN, TOTAL: 2.4 g/dL (ref 1.5–4.5)
Glucose: 99 mg/dL (ref 65–99)
POTASSIUM: 4.6 mmol/L (ref 3.5–5.2)
SODIUM: 140 mmol/L (ref 134–144)
TOTAL PROTEIN: 6.8 g/dL (ref 6.0–8.5)

## 2017-03-24 LAB — VITAMIN D 25 HYDROXY (VIT D DEFICIENCY, FRACTURES): VIT D 25 HYDROXY: 69.6 ng/mL (ref 30.0–100.0)

## 2017-03-24 LAB — HEMOGLOBIN A1C
Est. average glucose Bld gHb Est-mCnc: 111 mg/dL
Hgb A1c MFr Bld: 5.5 % (ref 4.8–5.6)

## 2017-03-24 LAB — LIPID PANEL
CHOLESTEROL TOTAL: 213 mg/dL — AB (ref 100–199)
Chol/HDL Ratio: 2.8 ratio (ref 0.0–4.4)
HDL: 76 mg/dL (ref >39)
LDL Calculated: 107 mg/dL — ABNORMAL HIGH (ref 0–99)
TRIGLYCERIDES: 148 mg/dL (ref 0–149)
VLDL Cholesterol Cal: 30 mg/dL (ref 5–40)

## 2018-03-30 ENCOUNTER — Other Ambulatory Visit: Payer: Self-pay | Admitting: Obstetrics and Gynecology

## 2018-04-05 ENCOUNTER — Encounter: Payer: 59 | Admitting: Obstetrics and Gynecology

## 2018-05-17 ENCOUNTER — Ambulatory Visit: Payer: 59

## 2018-05-30 ENCOUNTER — Ambulatory Visit (INDEPENDENT_AMBULATORY_CARE_PROVIDER_SITE_OTHER): Payer: Self-pay

## 2018-05-30 DIAGNOSIS — Z1211 Encounter for screening for malignant neoplasm of colon: Secondary | ICD-10-CM

## 2018-05-30 MED ORDER — NA SULFATE-K SULFATE-MG SULF 17.5-3.13-1.6 GM/177ML PO SOLN: 1.0000 | ORAL | 0 refills | Status: DC

## 2018-05-30 NOTE — Patient Instructions (Signed)
Erin Howard  Jun 14, 1966 MRN: [id number]     Procedure Date: 08/03/18 Time to register: 8:30am Place to register: [facility] Procedure Time: 9:30am Scheduled provider: Barney Drain, MD    PREPARATION FOR COLONOSCOPY WITH SUPREP BOWEL PREP KIT  Note: Suprep Bowel Prep Kit is a split-dose (2day) regimen. Consumption of BOTH 6-ounce bottles is required for a complete prep.  Please notify us immediately if you are diabetic, take iron supplements, or if you are on Coumadin or any other blood thinners.                                                                                                                                                     1 DAY BEFORE PROCEDURE:  DATE: 08/02/18   DAY: Thursday  clear liquids the entire day - NO SOLID FOOD.   At 6:00pm: Complete steps 1 through 4 below, using ONE (1) 6-ounce bottle, before going to bed. Step 1:  Pour ONE (1) 6-ounce bottle of SUPREP liquid into the mixing container.  Step 2:  Add cool drinking water to the 16 ounce line on the container and mix.  Note: Dilute the solution concentrate as directed prior to use. Step 3:  DRINK ALL the liquid in the container. Step 4:  You MUST drink an additional two (2) or more 16 ounce containers of water over the next one (1) hour.   Continue clear liquids.  DAY OF PROCEDURE:   DATE: 08/03/18   DAY: Friday If you take medications for your heart, blood pressure, or breathing, you may take these medications.    5 hours before your procedure at :4:30am Step 1:  Pour ONE (1) 6-ounce bottle of SUPREP liquid into the mixing container.  Step 2:  Add cool drinking water to the 16 ounce line on the container and mix.  Note: Dilute the solution concentrate as directed prior to use. Step 3:  DRINK ALL the liquid in the container. Step 4:  You MUST drink an additional two (2) or more 16 ounce containers of water over the next one (1) hour. You MUST complete the final glass of water at least 3  hours before your colonoscopy.   Nothing by mouth past 6:30am  You may take your morning medications with sip of water unless we have instructed otherwise.    Please see below for Dietary Information.  CLEAR LIQUIDS INCLUDE:  Water Jello (NOT red in color)   Ice Popsicles (NOT red in color)   Tea (sugar ok, no milk/cream) Powdered fruit flavored drinks  Coffee (sugar ok, no milk/cream) Gatorade/ Lemonade/ Kool-Aid  (NOT red in color)   Juice: apple, white grape, white cranberry Soft drinks  Clear bullion, consomme, broth (fat free beef/chicken/vegetable)  Carbonated beverages (any kind)  Strained chicken noodle soup Hard Candy   Remember: Clear  liquids are liquids that will allow you to see your fingers on the other side of a clear glass. Be sure liquids are NOT red in color, and not cloudy, but CLEAR.  DO NOT EAT OR DRINK ANY OF THE FOLLOWING:  Dairy products of any kind   Cranberry juice Tomato juice / V8 juice   Grapefruit juice Orange juice     Red grape juice  Do not eat any solid foods, including such foods as: cereal, oatmeal, yogurt, fruits, vegetables, creamed soups, eggs, bread, crackers, pureed foods in a blender, etc.   HELPFUL HINTS FOR DRINKING PREP SOLUTION:   Make sure prep is extremely cold. Mix and refrigerate the the morning of the prep. You may also put in the freezer.   You may try mixing some Crystal Light or Country Time Lemonade if you prefer. Mix in small amounts; add more if necessary.  Try drinking through a straw  Rinse mouth with water or a mouthwash between glasses, to remove after-taste.  Try sipping on a cold beverage /ice/ popsicles between glasses of prep.  Place a piece of sugar-free hard candy in mouth between glasses.  If you become nauseated, try consuming smaller amounts, or stretch out the time between glasses. Stop for 30-60 minutes, then slowly start back drinking.     OTHER INSTRUCTIONS  You will need a responsible adult at  least 50 years of age to accompany you and drive you home. This person must remain in the waiting room during your procedure. The hospital will cancel your procedure if you do not have a responsible adult with you.   1. Wear loose fitting clothing that is easily removed. 2. Leave jewelry and other valuables at home.  3. Remove all body piercing jewelry and leave at home. 4. Total time from sign-in until discharge is approximately 2-3 hours. 5. You should go home directly after your procedure and rest. You can resume normal activities the day after your procedure. 6. The day of your procedure you should not:  Drive  Make legal decisions  Operate machinery  Drink alcohol  Return to work   You may call the office (Dept: [phone]) before 5:00pm, or page the doctor on call [phone]) after 5:00pm, for further instructions, if necessary.   Insurance Information YOU WILL NEED TO CHECK WITH YOUR INSURANCE COMPANY FOR THE BENEFITS OF COVERAGE YOU HAVE FOR THIS PROCEDURE.  UNFORTUNATELY, NOT ALL INSURANCE COMPANIES HAVE BENEFITS TO COVER ALL OR PART OF THESE TYPES OF PROCEDURES.  IT IS YOUR RESPONSIBILITY TO CHECK YOUR BENEFITS, HOWEVER, WE WILL BE GLAD TO ASSIST YOU WITH ANY CODES YOUR INSURANCE COMPANY MAY NEED.    PLEASE NOTE THAT MOST INSURANCE COMPANIES WILL NOT COVER A SCREENING COLONOSCOPY FOR PEOPLE UNDER THE AGE OF 50  IF YOU HAVE BCBS INSURANCE, YOU MAY HAVE BENEFITS FOR A SCREENING COLONOSCOPY BUT IF POLYPS ARE FOUND THE DIAGNOSIS WILL CHANGE AND THEN YOU MAY HAVE A DEDUCTIBLE THAT WILL NEED TO BE MET. SO PLEASE MAKE SURE YOU CHECK YOUR BENEFITS FOR A SCREENING COLONOSCOPY AS WELL AS A DIAGNOSTIC COLONOSCOPY.

## 2018-05-30 NOTE — Progress Notes (Addendum)
Gastroenterology Pre-Procedure Review  Request Date:05/30/18 Requesting Physician: Angelina Ok NP [facility]. No previous tcs  PATIENT REVIEW QUESTIONS: The patient responded to the following health history questions as indicated:    1. Diabetes Melitis: no 2. Joint replacements in the past 12 months: no 3. Major health problems in the past 3 months: no 4. 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 ) 5. Has a defibrillator: no 6. Has been advised in past to take antibiotics in advance of a procedure like teeth cleaning: no 7. Family history of colon cancer: no  8. Alcohol Use: yes (socially) 9. History of sleep apnea: no  10. History of coronary artery or other vascular stents placed within the last 12 months: no 11. History of any prior anesthesia complications: no    MEDICATIONS & ALLERGIES:    Patient reports the following regarding taking any blood thinners:   Plavix? no Aspirin? no Coumadin? no Brilinta? no Xarelto? no Eliquis? no Pradaxa? no Savaysa? no Effient? no  Patient confirms/reports the following medications:  Current Outpatient Medications  Medication Sig Dispense Refill  . Cholecalciferol (VITAMIN D PO) Take by mouth.    Marland Kitchen KARIVA 0.15-0.02/0.01 MG (21/5) tablet TAKE 1 TABLET BY MOUTH ONCE DAILY. 28 tablet 0  . loratadine (CLARITIN) 10 MG tablet Take 10 mg by mouth daily.     Current Facility-Administered Medications  Medication Dose Route Frequency Provider Last Rate Last Dose  . cyanocobalamin ((VITAMIN B-12)) injection 1,000 mcg  1,000 mcg Intramuscular Once Shambley, Melody N, CNM        Patient confirms/reports the following allergies:  Allergies  Allergen Reactions  . Sulfa Antibiotics Hives and Shortness Of Breath    No orders of the defined types were placed in this encounter.   AUTHORIZATION INFORMATION Primary Insurance: [facility],  [place] #: [id number] Pre-Cert / Josem Kaufmann required: yes Pre-Cert / Auth #:  [id number]   SCHEDULE INFORMATION: Procedure has been scheduled as follows:  Date: 08/03/18, Time: 9:30 Location: [facility] Dr.Fiedls  This Gastroenterology Pre-Precedure Review Form is being routed to the following provider(s): Neil Crouch, PA

## 2018-06-10 NOTE — Progress Notes (Signed)
Ok to schedule.

## 2018-08-03 ENCOUNTER — Other Ambulatory Visit: Payer: Self-pay

## 2018-08-03 ENCOUNTER — Encounter (HOSPITAL_COMMUNITY): Payer: Self-pay | Admitting: *Deleted

## 2018-08-03 ENCOUNTER — Ambulatory Visit (HOSPITAL_COMMUNITY)
Admission: RE | Admit: 2018-08-03 | Discharge: 2018-08-03 | Disposition: A | Discharge status: Home / Self Care | Payer: 59 | Source: Ambulatory Visit | Attending: Gastroenterology | Admitting: Gastroenterology

## 2018-08-03 ENCOUNTER — Encounter (HOSPITAL_COMMUNITY)
Admission: RE | Disposition: A | Discharge status: Home / Self Care | Payer: Self-pay | Source: Ambulatory Visit | Attending: Gastroenterology

## 2018-08-03 DIAGNOSIS — Z87891 Personal history of nicotine dependence: Secondary | ICD-10-CM | POA: Diagnosis not present

## 2018-08-03 DIAGNOSIS — Z1211 Encounter for screening for malignant neoplasm of colon: Secondary | ICD-10-CM | POA: Diagnosis not present

## 2018-08-03 DIAGNOSIS — D123 Benign neoplasm of transverse colon: Secondary | ICD-10-CM

## 2018-08-03 DIAGNOSIS — D125 Benign neoplasm of sigmoid colon: Secondary | ICD-10-CM | POA: Diagnosis not present

## 2018-08-03 DIAGNOSIS — Q438 Other specified congenital malformations of intestine: Secondary | ICD-10-CM | POA: Diagnosis not present

## 2018-08-03 DIAGNOSIS — Z79899 Other long term (current) drug therapy: Secondary | ICD-10-CM | POA: Insufficient documentation

## 2018-08-03 DIAGNOSIS — K648 Other hemorrhoids: Secondary | ICD-10-CM | POA: Insufficient documentation

## 2018-08-03 DIAGNOSIS — Z791 Long term (current) use of non-steroidal anti-inflammatories (NSAID): Secondary | ICD-10-CM | POA: Diagnosis not present

## 2018-08-03 HISTORY — PX: COLONOSCOPY: SHX5424

## 2018-08-03 HISTORY — PX: POLYPECTOMY: SHX5525

## 2018-08-03 SURGERY — COLONOSCOPY
Anesthesia: Moderate Sedation

## 2018-08-03 MED ORDER — ATROPINE SULFATE 1 MG/ML IJ SOLN
INTRAMUSCULAR | Status: AC
  Filled 2018-08-03: qty 1

## 2018-08-03 MED ORDER — SODIUM CHLORIDE 0.9 % IV SOLN
INTRAVENOUS | Status: DC
  Administered 2018-08-03: 08:00:00 via INTRAVENOUS

## 2018-08-03 MED ORDER — ATROPINE SULFATE 1 MG/ML IJ SOLN
INTRAMUSCULAR | Status: DC | PRN
  Administered 2018-08-03: .5 mg via INTRAVENOUS

## 2018-08-03 MED ORDER — MIDAZOLAM HCL 5 MG/5ML IJ SOLN
INTRAMUSCULAR | Status: AC
  Filled 2018-08-03: qty 10

## 2018-08-03 MED ORDER — MIDAZOLAM HCL 5 MG/5ML IJ SOLN
INTRAMUSCULAR | Status: DC | PRN
  Administered 2018-08-03: 2 mg via INTRAVENOUS
  Administered 2018-08-03: 1 mg via INTRAVENOUS
  Administered 2018-08-03 (×3): 2 mg via INTRAVENOUS

## 2018-08-03 MED ORDER — STERILE WATER FOR IRRIGATION IR SOLN
Status: DC | PRN
  Administered 2018-08-03: 09:00:00

## 2018-08-03 MED ORDER — MEPERIDINE HCL 100 MG/ML IJ SOLN
INTRAMUSCULAR | Status: AC
  Filled 2018-08-03: qty 2

## 2018-08-03 MED ORDER — MEPERIDINE HCL 100 MG/ML IJ SOLN
INTRAMUSCULAR | Status: DC | PRN
  Administered 2018-08-03 (×4): 25 mg

## 2018-08-03 NOTE — H&P (Signed)
Primary Care Physician:  Strader, Lindsey F, NP Primary Gastroenterologist:  Dr. Fields  Pre-Procedure History & Physical: HPI:  Erin Howard is a 52 y.o. female here for COLON CANCER SCREENING.   Past Medical History:  Diagnosis Date  . Heavy periods   . Irregular periods     Past Surgical History:  Procedure Laterality Date  . APPENDECTOMY    . BREAST BIOPSY Left 12/03/2013   neg  . CHOLECYSTECTOMY    . GALLBLADDER SURGERY      Prior to Admission medications   Medication Sig Start Date End Date Taking? Authorizing Provider  cholecalciferol (VITAMIN D) 1000 units tablet Take 1,000 Units by mouth daily.   Yes [provider]  ibuprofen (ADVIL,MOTRIN) 200 MG tablet Take 400 mg by mouth daily as needed for headache or moderate pain.   Yes [provider]  KARIVA 0.15-0.02/0.01 MG (21/5) tablet TAKE 1 TABLET BY MOUTH ONCE DAILY. 03/30/18  Yes Shambley, Melody N, CNM  loratadine (CLARITIN) 10 MG tablet Take 10 mg by mouth daily.   Yes [provider]  Multiple Vitamin (MULTIVITAMIN WITH MINERALS) TABS tablet Take 1 tablet by mouth daily.   Yes [provider]  Na Sulfate-K Sulfate-Mg Sulf (SUPREP BOWEL PREP KIT) 17.5-3.13-1.6 GM/177ML SOLN Take 1 kit by mouth as directed. 05/30/18  Yes Lewis, Leslie S, PA-C  vitamin B-12 (CYANOCOBALAMIN) 1000 MCG tablet Take 1,000 mcg by mouth daily.   Yes [provider]    Allergies as of 05/30/2018 - Review Complete 05/30/2018  Allergen Reaction Noted  . Sulfa antibiotics Hives and Shortness Of Breath 02/19/2015    Family History  Problem Relation Age of Onset  . Cancer Maternal Uncle        lung  . Cancer Paternal Aunt        lung  . Cancer Paternal Grandfather        lung,liver  . Colon cancer Neg Hx     Social History   Socioeconomic History  . Marital status: Married    Spouse name: Not on file  . Number of children: Not on file  . Years of education: Not on file  . Highest education  level: Not on file  Occupational History  . Not on file  Social Needs  . Financial resource strain: Not on file  . Food insecurity:    Worry: Not on file    Inability: Not on file  . Transportation needs:    Medical: Not on file    Non-medical: Not on file  Tobacco Use  . Smoking status: Former Smoker  . Smokeless tobacco: Never Used  Substance and Sexual Activity  . Alcohol use: Yes    Comment: occas  . Drug use: No  . Sexual activity: Yes    Birth control/protection: Pill  Lifestyle  . Physical activity:    Days per week: Not on file    Minutes per session: Not on file  . Stress: Not on file  Relationships  . Social connections:    Talks on phone: Not on file    Gets together: Not on file    Attends religious service: Not on file    Active member of club or organization: Not on file    Attends meetings of clubs or organizations: Not on file    Relationship status: Not on file  . Intimate partner violence:    Fear of current or ex partner: Not on file    Emotionally abused: Not on file      Physically abused: Not on file    Forced sexual activity: Not on file  Other Topics Concern  . Not on file  Social History Narrative  . Not on file    Review of Systems: See HPI, otherwise negative ROS   Physical Exam: BP 123/75   Pulse 78   Temp 98.2 F (36.8 C) (Oral)   Resp 18   Ht 5' 1" (1.549 m)   Wt 77.1 kg   LMP 07/17/2018   SpO2 97%   BMI 32.12 kg/m  General:   Alert,  pleasant and cooperative in NAD Head:  Normocephalic and atraumatic. Neck:  Supple; Lungs:  Clear throughout to auscultation.    Heart:  Regular rate and rhythm. Abdomen:  Soft, nontender and nondistended. Normal bowel sounds, without guarding, and without rebound.   Neurologic:  Alert and  oriented x4;  grossly normal neurologically.  Impression/Plan:    SCREENING  Plan:  1. TCS TODAY DISCUSSED PROCEDURE, BENEFITS, & RISKS: < 1% chance of medication reaction, bleeding, perforation,  or rupture of spleen/liver. 

## 2018-08-03 NOTE — Discharge Instructions (Signed)
You had 5 small polyps removed. You have SMALL internal hemorrhoids.   DRINK WATER TO KEEP YOUR URINE LIGHT YELLOW.  CONTINUE TO MONITOR SYMPTOMS. YOUR BODY MASS INDEX IS OVER 30 WHICH MEANS YOU ARE OBESE. OBESITY IS ASSOCIATED WITH AN INCREASED FOR CIRRHOSIS AND ALL CANCERS, INCLUDING ESOPHAGEAL AND COLON CANCER. A WEIGHT OF 155 LBS OR LESS  WILL GET YOUR BODY MASS INDEX(BMI) UNDER 30.   FOLLOW A HIGH FIBER DIET. AVOID ITEMS THAT CAUSE BLOATING & GAS. SEE INFO BELOW.  YOUR BIOPSY RESULTS WILL BE BACK IN 5 BUSINESS DAYS.  Next colonoscopy in 3-5 years.    Colonoscopy Care After Read the instructions outlined below and refer to this sheet in the next week. These discharge instructions provide you with general information on caring for yourself after you leave the hospital. While your treatment has been planned according to the most current medical practices available, unavoidable complications occasionally occur. If you have any problems or questions after discharge, call DR. Levar Fayson, [phone].  ACTIVITY  You may resume your regular activity, but move at a slower pace for the next 24 hours.   Take frequent rest periods for the next 24 hours.   Walking will help get rid of the air and reduce the bloated feeling in your belly (abdomen).   No driving for 24 hours (because of the medicine (anesthesia) used during the test).   You may shower.   Do not sign any important legal documents or operate any machinery for 24 hours (because of the anesthesia used during the test).    NUTRITION  Drink plenty of fluids.   You may resume your normal diet as instructed by your doctor.   Begin with a light meal and progress to your normal diet. Heavy or fried foods are harder to digest and may make you feel sick to your stomach (nauseated).   Avoid alcoholic beverages for 24 hours or as instructed.    MEDICATIONS  You may resume your normal medications.   WHAT YOU CAN EXPECT  TODAY  Some feelings of bloating in the abdomen.   Passage of more gas than usual.   Spotting of blood in your stool or on the toilet paper  .  IF YOU HAD POLYPS REMOVED DURING THE COLONOSCOPY:  Eat a soft diet IF YOU HAVE NAUSEA, BLOATING, ABDOMINAL PAIN, OR VOMITING.    FINDING OUT THE RESULTS OF YOUR TEST Not all test results are available during your visit. DR. Oneida Alar WILL CALL YOU WITHIN 14 DAYS OF YOUR PROCEDUE WITH YOUR RESULTS. Do not assume everything is normal if you have not heard from DR. Truda Staub, CALL HER OFFICE AT [phone].  SEEK IMMEDIATE MEDICAL ATTENTION AND CALL THE OFFICE: [phone] IF:  You have more than a spotting of blood in your stool.   Your belly is swollen (abdominal distention).   You are nauseated or vomiting.   You have a temperature over 101F.   You have abdominal pain or discomfort that is severe or gets worse throughout the day.   High-Fiber Diet A high-fiber diet changes your normal diet to include more whole grains, legumes, fruits, and vegetables. Changes in the diet involve replacing refined carbohydrates with unrefined foods. The calorie level of the diet is essentially unchanged. The Dietary Reference Intake (recommended amount) for adult males is 38 grams per day. For adult females, it is 25 grams per day. Pregnant and lactating women should consume 28 grams of fiber per day. Fiber is the intact part of  a plant that is not broken down during digestion. Functional fiber is fiber that has been isolated from the plant to provide a beneficial effect in the body. PURPOSE  Increase stool bulk.   Ease and regulate bowel movements.   Lower cholesterol.   REDUCE RISK OF COLON CANCER  INDICATIONS THAT YOU NEED MORE FIBER  Constipation and hemorrhoids.   Uncomplicated diverticulosis (intestine condition) and irritable bowel syndrome.   Weight management.   As a protective measure against hardening of the arteries (atherosclerosis),  diabetes, and cancer.   GUIDELINES FOR INCREASING FIBER IN THE DIET  Start adding fiber to the diet slowly. A gradual increase of about 5 more grams (2 slices of whole-wheat bread, 2 servings of most fruits or vegetables, or 1 bowl of high-fiber cereal) per day is best. Too rapid an increase in fiber may result in constipation, flatulence, and bloating.   Drink enough water and fluids to keep your urine clear or pale yellow. Water, juice, or caffeine-free drinks are recommended. Not drinking enough fluid may cause constipation.   Eat a variety of high-fiber foods rather than one type of fiber.   Try to increase your intake of fiber through using high-fiber foods rather than fiber pills or supplements that contain small amounts of fiber.   The goal is to change the types of food eaten. Do not supplement your present diet with high-fiber foods, but replace foods in your present diet.   INCLUDE A VARIETY OF FIBER SOURCES  Replace refined and processed grains with whole grains, canned fruits with fresh fruits, and incorporate other fiber sources. White rice, white breads, and most bakery goods contain little or no fiber.   Brown whole-grain rice, buckwheat oats, and many fruits and vegetables are all good sources of fiber. These include: broccoli, Brussels sprouts, cabbage, cauliflower, beets, sweet potatoes, white potatoes (skin on), carrots, tomatoes, eggplant, squash, berries, fresh fruits, and dried fruits.   Cereals appear to be the richest source of fiber. Cereal fiber is found in whole grains and bran. Bran is the fiber-rich outer coat of cereal grain, which is largely removed in refining. In whole-grain cereals, the bran remains. In breakfast cereals, the largest amount of fiber is found in those with "bran" in their names. The fiber content is sometimes indicated on the label.   You may need to include additional fruits and vegetables each day.   In baking, for 1 cup white flour, you may  use the following substitutions:   1 cup whole-wheat flour minus 2 tablespoons.   1/2 cup white flour plus 1/2 cup whole-wheat flour.   Polyps, Colon  A polyp is extra tissue that grows inside your body. Colon polyps grow in the large intestine. The large intestine, also called the colon, is part of your digestive system. It is a long, hollow tube at the end of your digestive tract where your body makes and stores stool. Most polyps are not dangerous. They are benign. This means they are not cancerous. But over time, some types of polyps can turn into cancer. Polyps that are smaller than a pea are usually not harmful. But larger polyps could someday become or may already be cancerous. To be safe, doctors remove all polyps and test them.   WHO GETS POLYPS? Anyone can get polyps, but certain people are more likely than others. You may have a greater chance of getting polyps if:  You are over 50.   You have had polyps before.   Someone  in your family has had polyps.   Someone in your family has had cancer of the large intestine.   Find out if someone in your family has had polyps. You may also be more likely to get polyps if you:   Eat a lot of fatty foods   Smoke   Drink alcohol   Do not exercise  Eat too much   PREVENTION There is not one sure way to prevent polyps. You might be able to lower your risk of getting them if you:  Eat more fruits and vegetables and less fatty food.   Do not smoke.   Avoid alcohol.   Exercise every day.   Lose weight if you are overweight.   Eating more calcium and folate can also lower your risk of getting polyps. Some foods that are rich in calcium are milk, cheese, and broccoli. Some foods that are rich in folate are chickpeas, kidney beans, and spinach.

## 2018-08-03 NOTE — Op Note (Signed)
[facility] Patient Name: Erin Howard Procedure Date: 08/03/2018 8:10 AM MRN: [id number] Date of Birth: 12/02/1965 Attending MD: Barney Drain MD, MD CSN: [id number] Age: 52 Admit Type: Outpatient Procedure:                Colonoscopy WITH COLD SNARE POLYPECTOMY Indications:              Screening for colorectal malignant neoplasm Providers:                Barney Drain MD, MD, Janeece Riggers, RN, Rosina Lowenstein,                            RN Referring MD:             Renee Rival Medicines:                Atropine 0.5 mg IV, Meperidine 100 mg IV, Midazolam                            9 mg IV Complications:            No immediate complications. Estimated Blood Loss:     Estimated blood loss was minimal. Procedure:                Pre-Anesthesia Assessment:                           - Prior to the procedure, a History and Physical                            was performed, and patient medications and                            allergies were reviewed. The patient's tolerance of                            previous anesthesia was also reviewed. The risks                            and benefits of the procedure and the sedation                            options and risks were discussed with the patient.                            All questions were answered, and informed consent                            was obtained. Prior Anticoagulants: The patient has                            taken no previous anticoagulant or antiplatelet                            agents. ASA Grade Assessment: I - A normal, healthy  patient. After reviewing the risks and benefits,                            the patient was deemed in satisfactory condition to                            undergo the procedure. After obtaining informed                            consent, the colonoscope was passed under direct                            vision. Throughout the procedure, the patient's                             blood pressure, pulse, and oxygen saturations were                            monitored continuously. The CF-HQ190L ([id number])                            scope was introduced through the anus and advanced                            to the the cecum, identified by appendiceal orifice                            and ileocecal valve. The colonoscopy was                            technically difficult and complex due to a tortuous                            colon AND VASOVAGAL RESPONSE WHICH RESOLVED AFTER                            ATROPINE 0.5 MG IV. Successful completion of the                            procedure was aided by straightening and shortening                            the scope to obtain bowel loop reduction and                            COLOWRAP. The patient tolerated the procedure                            fairly well. The quality of the bowel preparation                            was excellent. The ileocecal valve, appendiceal  orifice, and rectum were photographed. Scope In: 9:04:19 AM Scope Out: 9:28:43 AM Scope Withdrawal Time: 0 hours 17 minutes 33 seconds  Total Procedure Duration: 0 hours 24 minutes 24 seconds  Findings:      Five sessile polyps were found in the sigmoid colon and hepatic flexure.       The polyps were 3 to 5 mm in size. These polyps were removed with a cold       snare. Resection and retrieval were complete.      Internal hemorrhoids were found. The hemorrhoids were small.      The recto-sigmoid colon, sigmoid colon and descending colon were       moderately tortuous. Impression:               - Five 3 to 5 mm polyps in the sigmoid colon and at                            the hepatic flexure, removed with a cold snare.                            Resected and retrieved 1 SERRATED, 1 SIMPLE, AND 3                            HYPERPLASTIC APPEARING POLYPS.                           - Internal  hemorrhoids.                           - MODERATELY Tortuous LEFT colon. Moderate Sedation:      Moderate (conscious) sedation was administered by the endoscopy nurse       and supervised by the endoscopist. The following parameters were       monitored: oxygen saturation, heart rate, blood pressure, and response       to care. Total physician intraservice time was 35 minutes. Recommendation:           - Patient has a contact number available for                            emergencies. The signs and symptoms of potential                            delayed complications were discussed with the                            patient. Return to normal activities tomorrow.                            Written discharge instructions were provided to the                            patient.                           - High fiber diet.                           -  Continue present medications.                           - Await pathology results.                           - Repeat colonoscopy in 3-5 years for surveillance. Procedure Code(s):        --- Professional ---                           [phone], Colonoscopy, flexible; with removal of                            tumor(s), polyp(s), or other lesion(s) by snare                            technique                           [id number], Moderate sedation; each additional 15                            minutes intraservice time                           G0500, Moderate sedation services provided by the                            same physician or other qualified health care                            professional performing a gastrointestinal                            endoscopic service that sedation supports,                            requiring the presence of an independent trained                            observer to assist in the monitoring of the                            patient's level of consciousness and physiological                             status; initial 15 minutes of intra-service time;                            patient age 4 years or older (additional time may                            be reported with [phone], as appropriate) Diagnosis Code(s):        --- Professional ---  Z12.11, Encounter for screening for malignant                            neoplasm of colon                           D12.5, Benign neoplasm of sigmoid colon                           D12.3, Benign neoplasm of transverse colon (hepatic                            flexure or splenic flexure)                           K64.8, Other hemorrhoids                           Q43.8, Other specified congenital malformations of                            intestine CPT copyright 2018 [employer]. All rights reserved. The codes documented in this report are preliminary and upon coder review may  be revised to meet current compliance requirements. Barney Drain, MD Barney Drain MD, MD 08/03/2018 9:43:54 AM This report has been signed electronically. Number of Addenda: 0

## 2018-08-06 NOTE — Progress Notes (Signed)
PT is aware.

## 2018-08-09 ENCOUNTER — Encounter (HOSPITAL_COMMUNITY): Payer: Self-pay | Admitting: Gastroenterology

## 2019-08-06 ENCOUNTER — Emergency Department (HOSPITAL_COMMUNITY): Payer: Managed Care, Other (non HMO)

## 2019-08-06 ENCOUNTER — Emergency Department (HOSPITAL_COMMUNITY)
Admission: EM | Admit: 2019-08-06 | Discharge: 2019-08-06 | Disposition: A | Discharge status: Home / Self Care | Payer: Managed Care, Other (non HMO) | Attending: Emergency Medicine | Admitting: Emergency Medicine

## 2019-08-06 ENCOUNTER — Encounter (HOSPITAL_COMMUNITY): Payer: Self-pay

## 2019-08-06 ENCOUNTER — Other Ambulatory Visit: Payer: Self-pay

## 2019-08-06 DIAGNOSIS — R0789 Other chest pain: Secondary | ICD-10-CM | POA: Diagnosis not present

## 2019-08-06 DIAGNOSIS — R202 Paresthesia of skin: Secondary | ICD-10-CM | POA: Diagnosis present

## 2019-08-06 DIAGNOSIS — Z79899 Other long term (current) drug therapy: Secondary | ICD-10-CM | POA: Diagnosis not present

## 2019-08-06 DIAGNOSIS — I10 Essential (primary) hypertension: Secondary | ICD-10-CM | POA: Insufficient documentation

## 2019-08-06 DIAGNOSIS — G43009 Migraine without aura, not intractable, without status migrainosus: Secondary | ICD-10-CM

## 2019-08-06 DIAGNOSIS — R2990 Unspecified symptoms and signs involving the nervous system: Secondary | ICD-10-CM

## 2019-08-06 DIAGNOSIS — R200 Anesthesia of skin: Secondary | ICD-10-CM | POA: Insufficient documentation

## 2019-08-06 DIAGNOSIS — R531 Weakness: Secondary | ICD-10-CM | POA: Insufficient documentation

## 2019-08-06 DIAGNOSIS — H5462 Unqualified visual loss, left eye, normal vision right eye: Secondary | ICD-10-CM

## 2019-08-06 DIAGNOSIS — Z87891 Personal history of nicotine dependence: Secondary | ICD-10-CM | POA: Insufficient documentation

## 2019-08-06 LAB — RAPID URINE DRUG SCREEN, HOSP PERFORMED
Amphetamines: NOT DETECTED
Barbiturates: NOT DETECTED
Benzodiazepines: NOT DETECTED
Cocaine: NOT DETECTED
Opiates: NOT DETECTED
Tetrahydrocannabinol: NOT DETECTED

## 2019-08-06 LAB — I-STAT CHEM 8, ED
BUN: 10 mg/dL (ref 6–20)
Calcium, Ion: 1.15 mmol/L (ref 1.15–1.40)
Chloride: 106 mmol/L (ref 98–111)
Creatinine, Ser: 0.5 mg/dL (ref 0.44–1.00)
Glucose, Bld: 100 mg/dL — ABNORMAL HIGH (ref 70–99)
HCT: 42 % (ref 36.0–46.0)
Hemoglobin: 14.3 g/dL (ref 12.0–15.0)
Potassium: 3.7 mmol/L (ref 3.5–5.1)
Sodium: 139 mmol/L (ref 135–145)
TCO2: 20 mmol/L — ABNORMAL LOW (ref 22–32)

## 2019-08-06 LAB — URINALYSIS, ROUTINE W REFLEX MICROSCOPIC

## 2019-08-06 LAB — URINALYSIS, MICROSCOPIC (REFLEX): RBC / HPF: >50 RBC/hpf (ref 0–5)

## 2019-08-06 LAB — CBC
HCT: 41.4 % (ref 36.0–46.0)
Hemoglobin: 13.5 g/dL (ref 12.0–15.0)
MCH: 29.8 pg (ref 26.0–34.0)
MCHC: 32.6 g/dL (ref 30.0–36.0)
MCV: 91.4 fL (ref 80.0–100.0)
Platelets: 300 10*3/uL (ref 150–400)
RBC: 4.53 MIL/uL (ref 3.87–5.11)
RDW: 12.8 % (ref 11.5–15.5)
WBC: 9.2 10*3/uL (ref 4.0–10.5)
nRBC: 0 % (ref 0.0–0.2)

## 2019-08-06 LAB — DIFFERENTIAL
Abs Immature Granulocytes: 0.03 10*3/uL (ref 0.00–0.07)
Basophils Absolute: 0.1 10*3/uL (ref 0.0–0.1)
Basophils Relative: 1 %
Eosinophils Absolute: 0.2 10*3/uL (ref 0.0–0.5)
Eosinophils Relative: 2 %
Immature Granulocytes: 0 %
Lymphocytes Relative: 29 %
Lymphs Abs: 2.6 10*3/uL (ref 0.7–4.0)
Monocytes Absolute: 0.6 10*3/uL (ref 0.1–1.0)
Monocytes Relative: 6 %
Neutro Abs: 5.7 10*3/uL (ref 1.7–7.7)
Neutrophils Relative %: 62 %

## 2019-08-06 LAB — APTT: aPTT: 27 seconds (ref 24–36)

## 2019-08-06 LAB — COMPREHENSIVE METABOLIC PANEL
ALT: 29 U/L (ref 0–44)
AST: 32 U/L (ref 15–41)
Albumin: 3.7 g/dL (ref 3.5–5.0)
Alkaline Phosphatase: 49 U/L (ref 38–126)
Anion gap: 12 (ref 5–15)
BUN: 10 mg/dL (ref 6–20)
CO2: 20 mmol/L — ABNORMAL LOW (ref 22–32)
Calcium: 9.5 mg/dL (ref 8.9–10.3)
Chloride: 105 mmol/L (ref 98–111)
Creatinine, Ser: 0.66 mg/dL (ref 0.44–1.00)
GFR calc Af Amer: >60 mL/min (ref >60)
GFR calc non Af Amer: >60 mL/min (ref >60)
Glucose, Bld: 102 mg/dL — ABNORMAL HIGH (ref 70–99)
Potassium: 3.7 mmol/L (ref 3.5–5.1)
Sodium: 137 mmol/L (ref 135–145)
Total Bilirubin: 0.5 mg/dL (ref 0.3–1.2)
Total Protein: 6.7 g/dL (ref 6.5–8.1)

## 2019-08-06 LAB — I-STAT BETA HCG BLOOD, ED (MC, WL, AP ONLY): I-stat hCG, quantitative: <5 m[IU]/mL (ref <5)

## 2019-08-06 LAB — TROPONIN I (HIGH SENSITIVITY): Troponin I (High Sensitivity): 3 ng/L (ref <18)

## 2019-08-06 LAB — PROTIME-INR
INR: 1 (ref 0.8–1.2)
Prothrombin Time: 12.8 seconds (ref 11.4–15.2)

## 2019-08-06 IMAGING — CT CT HEAD CODE STROKE
3 series · 15 of 47 positions shown, 18 images · non-contrast
Comparison: No pertinent prior studies available for comparison.

CLINICAL DATA: Code stroke. Additional history obtained: Left-sided
numbness

EXAM:
CT HEAD WITHOUT CONTRAST
TECHNIQUE: Contiguous axial images were obtained from the base of the skull
through the vertex without intravenous contrast.

[Series 3: head 5.0 st · axial · 0.42mm/px · z∈[-93,+32]mm · 9 of 31 slices shown, 12 images]
[im 3/31  brain]
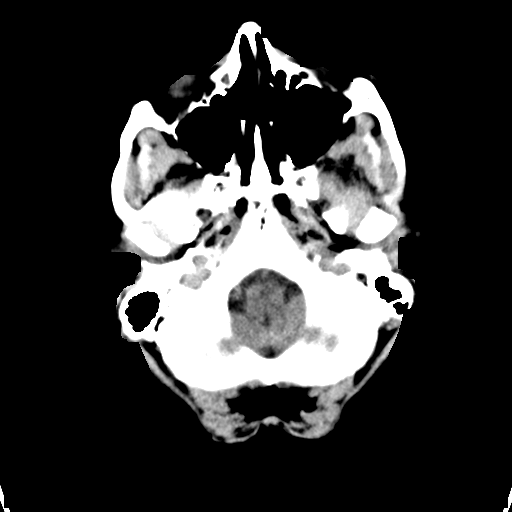
[im 3/31  bone]
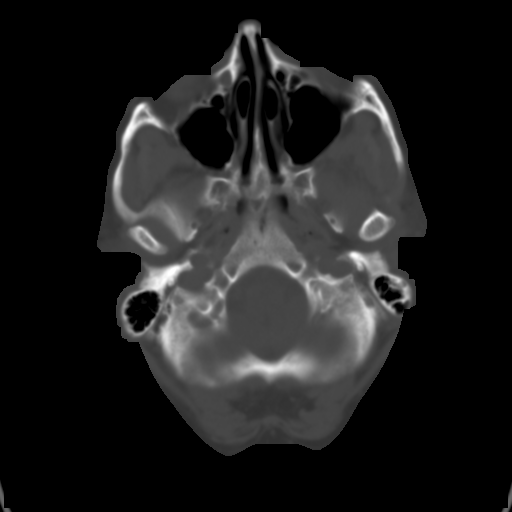
[im 6/31  brain]
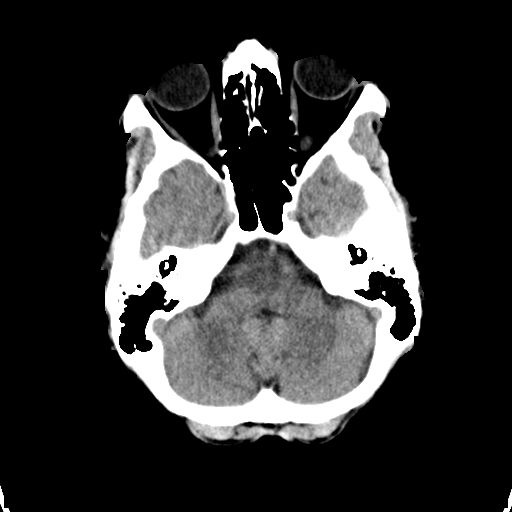
[im 9/31  brain]
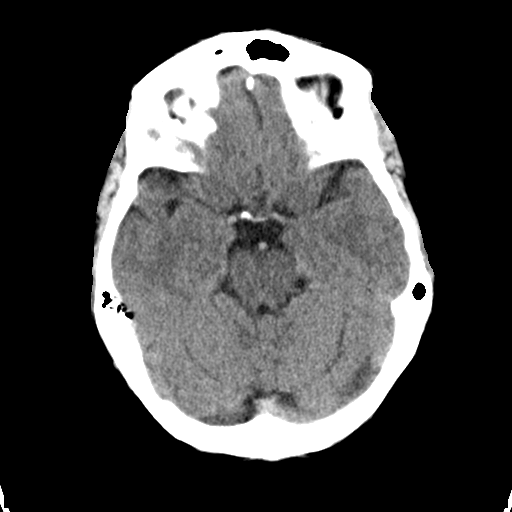
[im 12/31  brain]
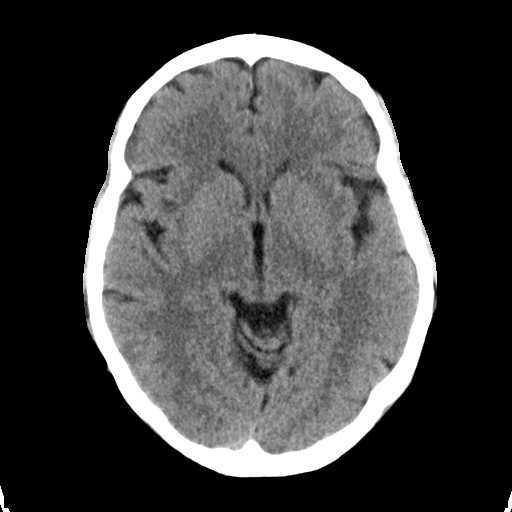
[im 16/31  brain]
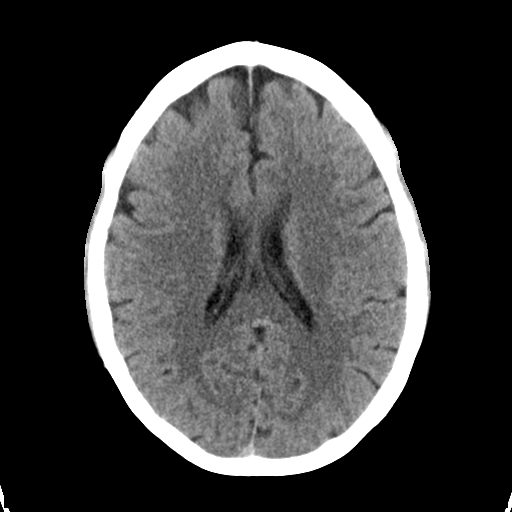
[im 16/31  bone]
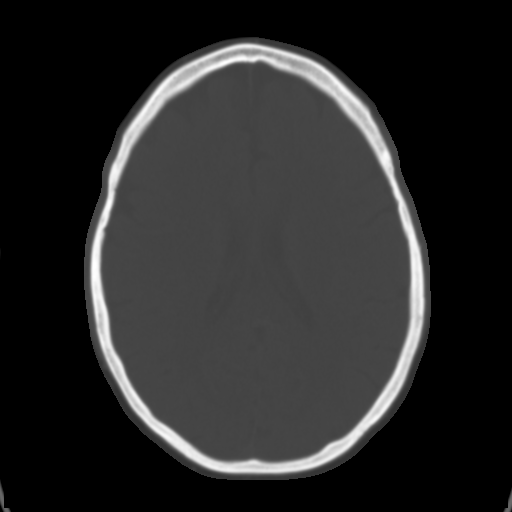
[im 19/31  brain]
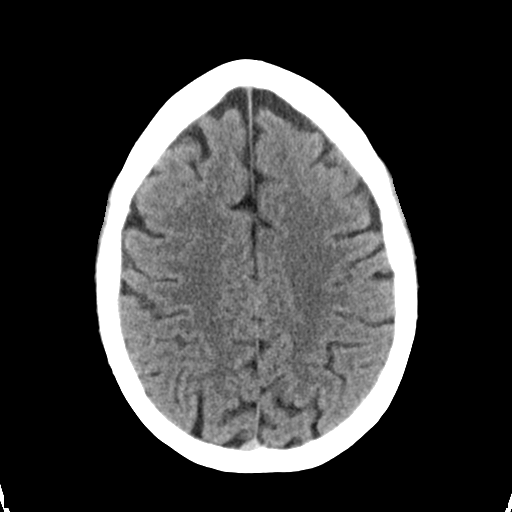
[im 22/31  brain]
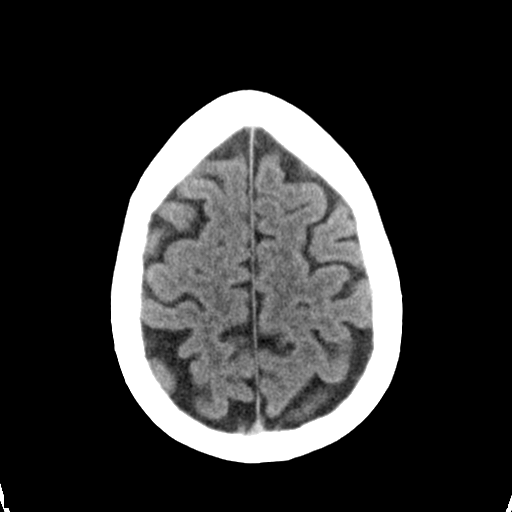
[im 25/31  brain]
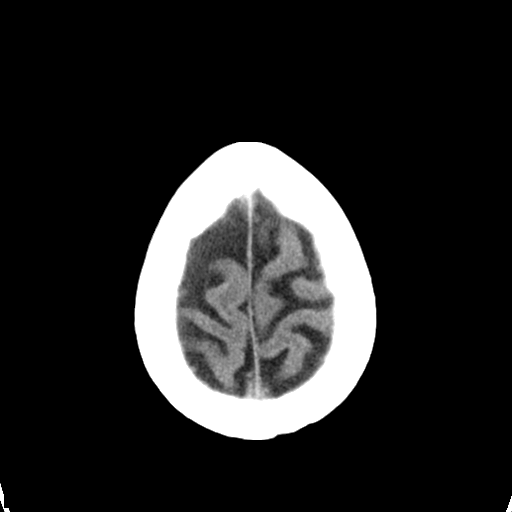
[im 28/31  brain]
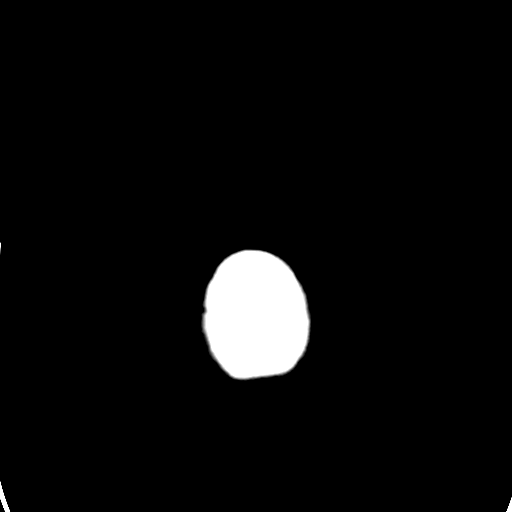
[im 28/31  bone]
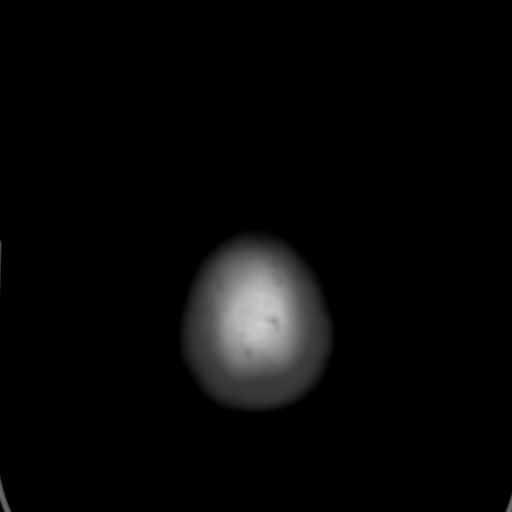

[Series 5: head 3.0 cor st · coronal · 0.34mm/px · 3 of 67 slices shown]
[im 23/67  brain]
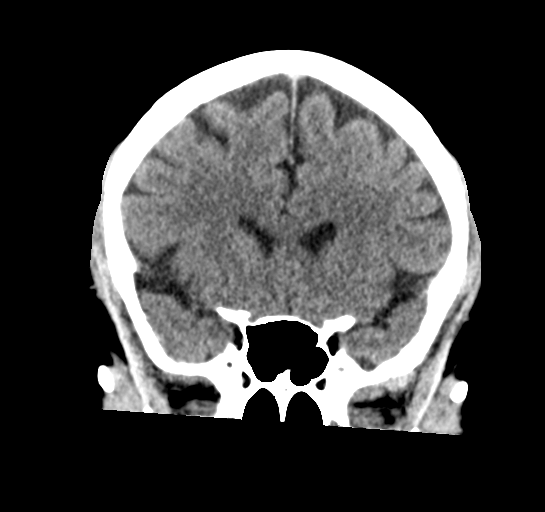
[im 30/67  brain]
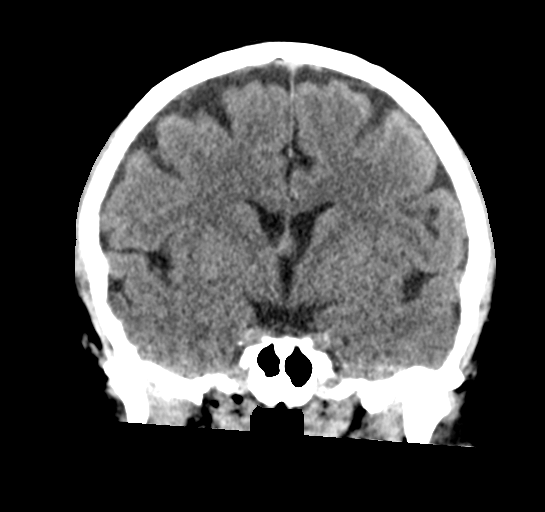
[im 37/67  brain]
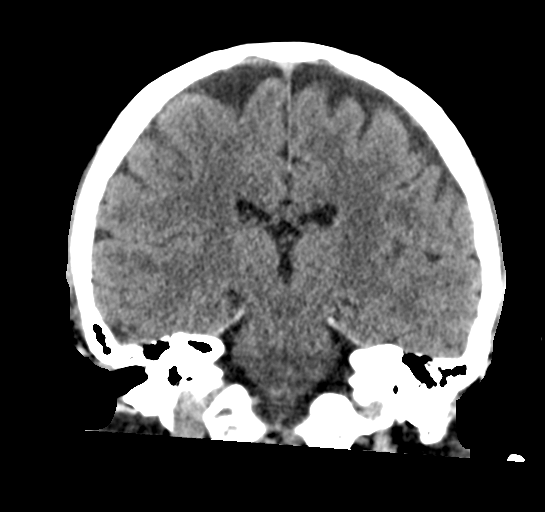

[Series 6: head 3.0 sag st · sagittal · 0.36mm/px · 3 of 67 slices shown]
[im 23/67  brain]
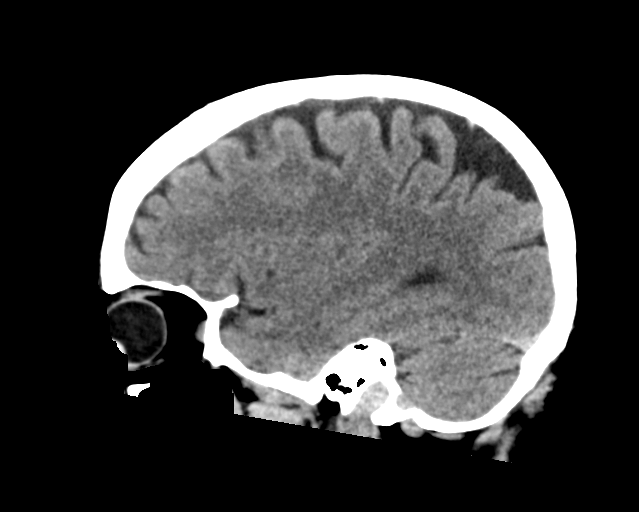
[im 34/67  brain]
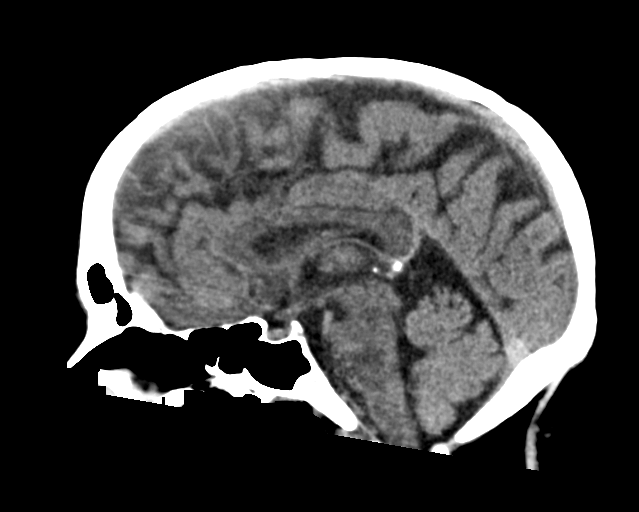
[im 45/67  brain]
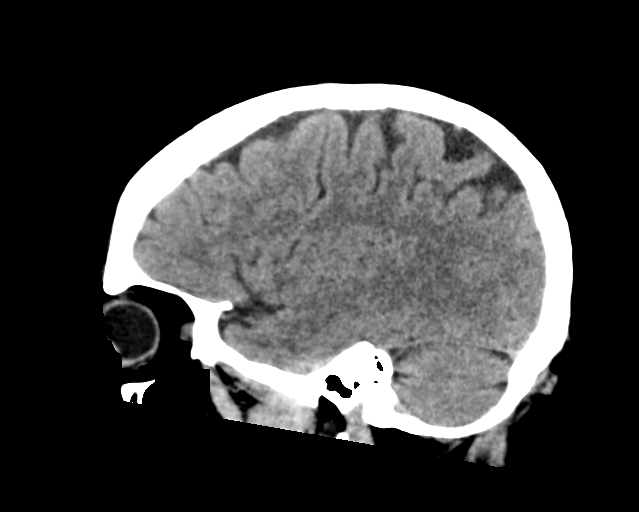

[15 of 47 positions shown; findings below may reference images not displayed]

FINDINGS: Brain:

No evidence of acute intracranial hemorrhage.

No demarcated cortical infarction.

No evidence of intracranial mass.

No midline shift or extra-axial fluid collection.

Vascular: No hyperdense vessel

Skull: Normal. Negative for fracture or focal lesion.

Sinuses/Orbits: Negative

ASPECTS (Alberta Stroke Program Early CT Score)

- Ganglionic level infarction (caudate, lentiform nuclei, internal
capsule, insula, M1-M3 cortex): 7

- Supraganglionic infarction (M4-M6 cortex): 3

Total score (0-10 with 10 being normal): 10

These results were called by telephone at the time of interpretation
on [DATE] at [DATE] to provider Dr. KOZLIK, Who verbally
acknowledged these results.
IMPRESSION: Normal non-contrast head CT without evidence of acute intracranial
abnormality. ASPECTS 10.

## 2019-08-06 IMAGING — MR MR MRA HEAD W/O CM
1 series · 19 of 48 positions shown · IV contrast (gadavist)
Comparison: CT head [DATE]

CLINICAL DATA: TIA.  Blurred vision left eye.

EXAM:
MRI HEAD WITHOUT   CONTRAST
MRA HEAD WITHOUT CONTRAST
MRA NECK WITHOUT AND WITH CONTRAST
TECHNIQUE: Multiplanar, multiecho pulse sequences of the brain and surrounding
structures were obtained without intravenous contrast. Angiographic
images of the Circle of Willis were obtained using MRA technique
without intravenous contrast. Angiographic images of the neck were
obtained using MRA technique without and with intravenous contrast.
Carotid stenosis measurements (when applicable) are obtained
utilizing NASCET criteria, using the distal internal carotid
diameter as the denominator.
CONTRAST:  10mL GADAVIST GADOBUTROL 1 MMOL/ML IV SOLN

[Series 1: 3d cow · axial · 0.5mm · 0.41mm/px · z∈[-119,-21]mm · 19 of 208 slices shown]
[im 1/208]
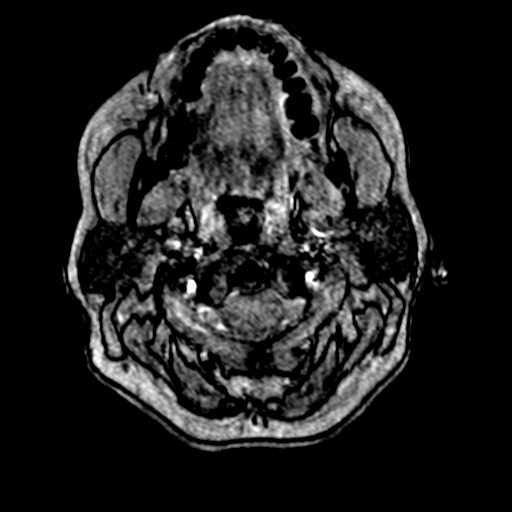
[im 5/208]
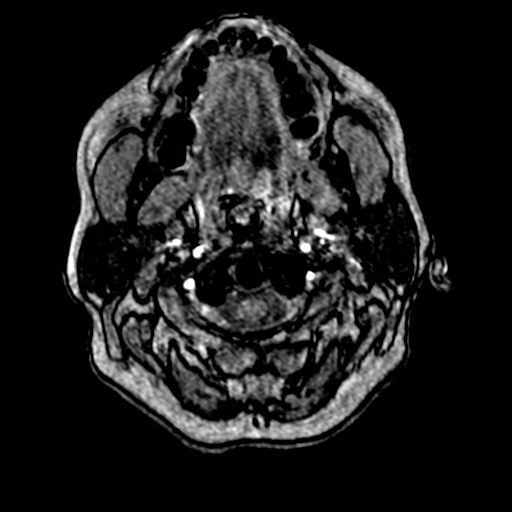
[im 9/208]
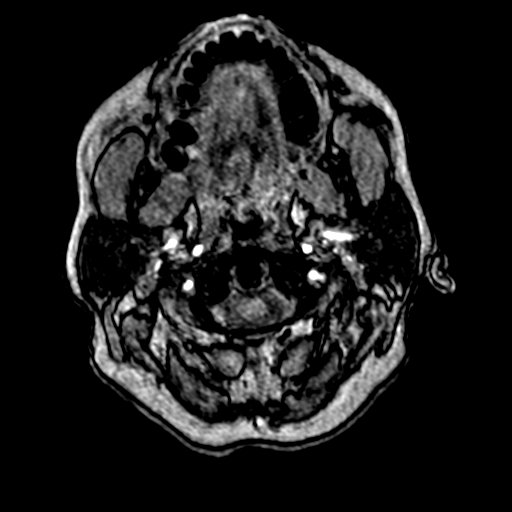
[im 14/208]
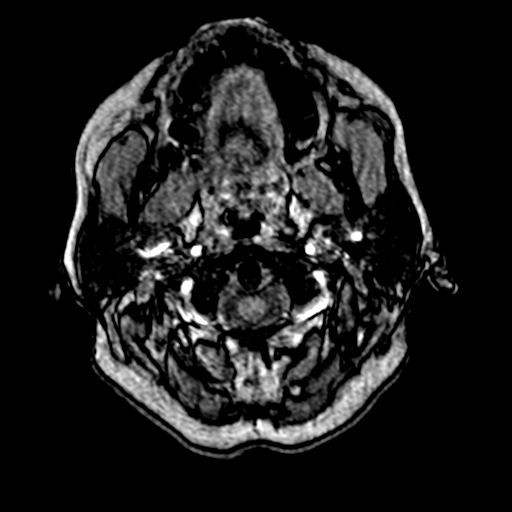
[im 18/208]
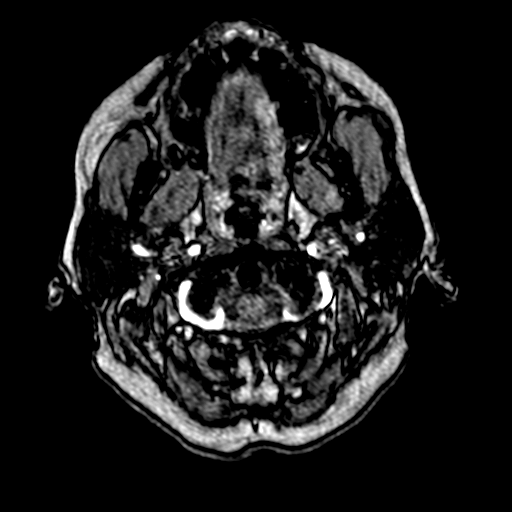
[im 23/208]
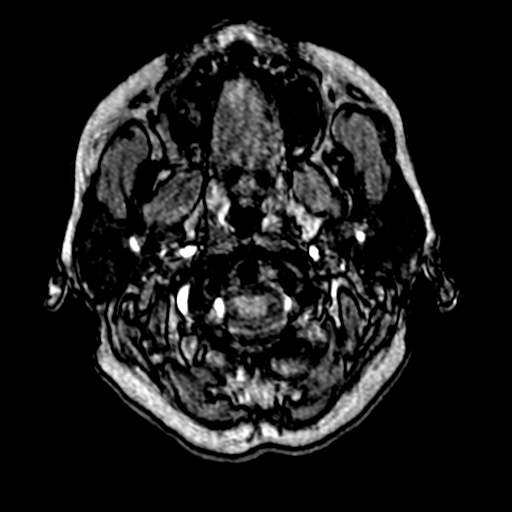
[im 27/208]
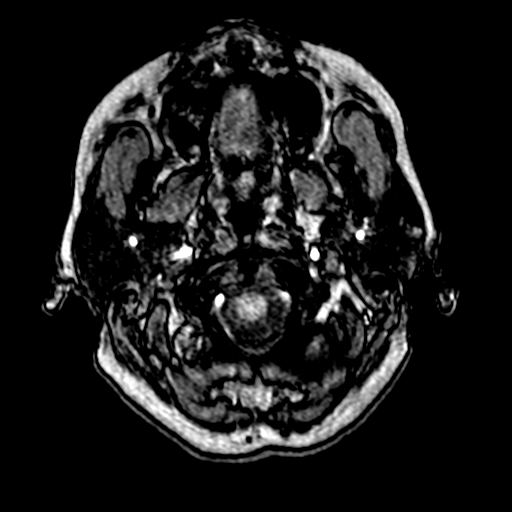
[im 31/208]
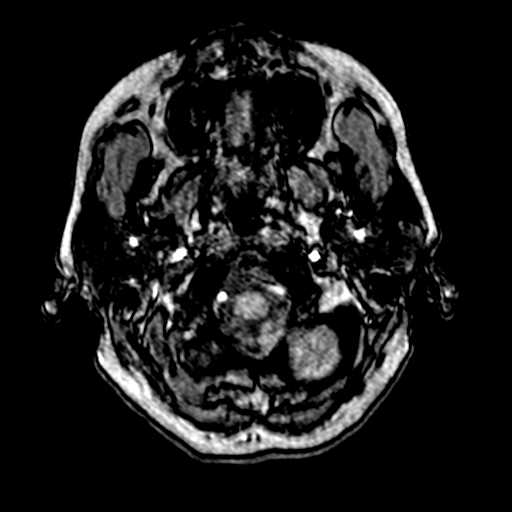
[im 36/208]
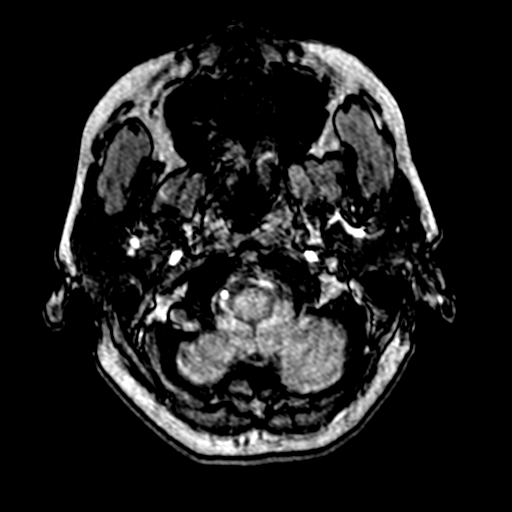
[im 40/208]
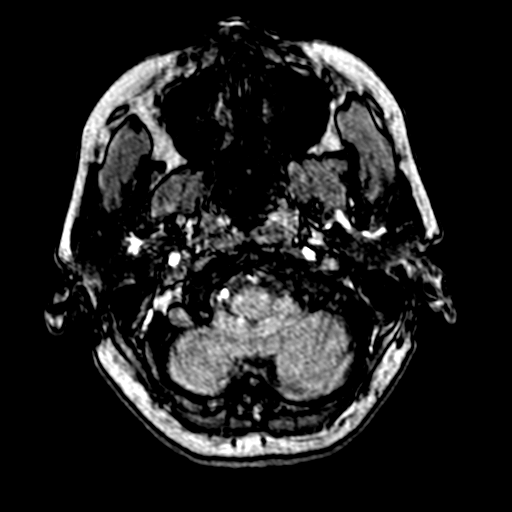
[im 45/208]
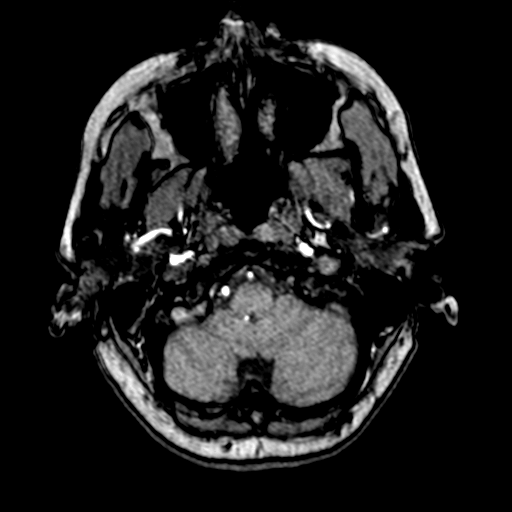
[im 67/208]
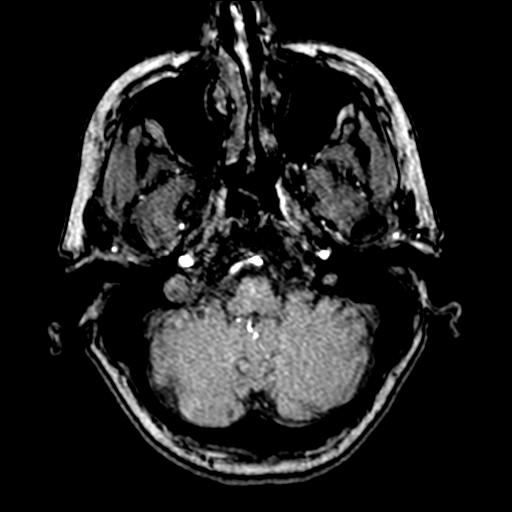
[im 93/208]
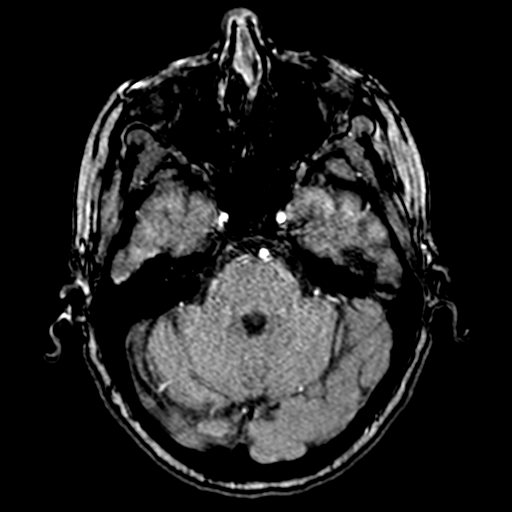
[im 106/208]
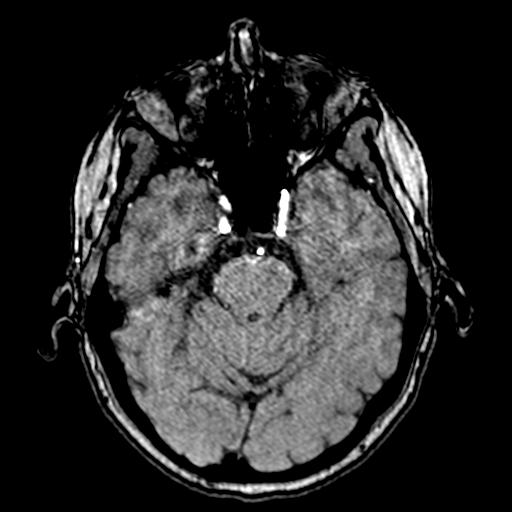
[im 119/208]
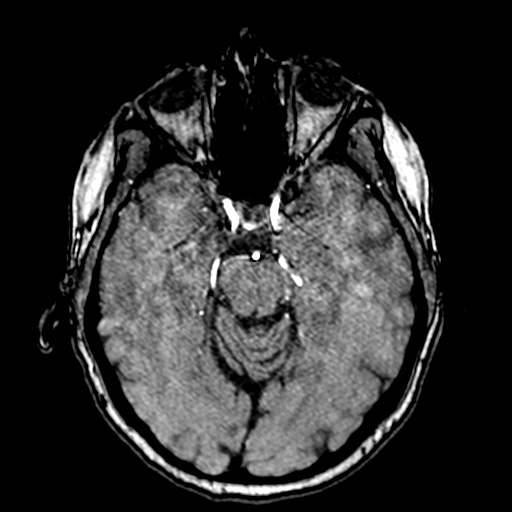
[im 146/208]
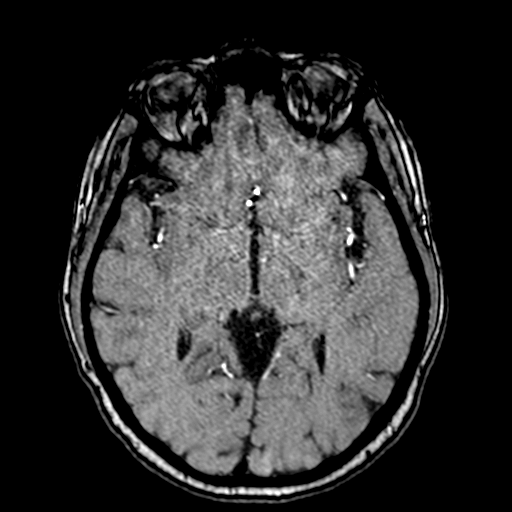
[im 172/208]
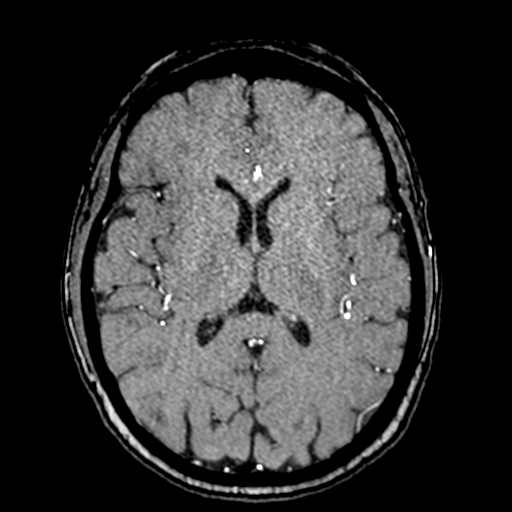
[im 177/208]
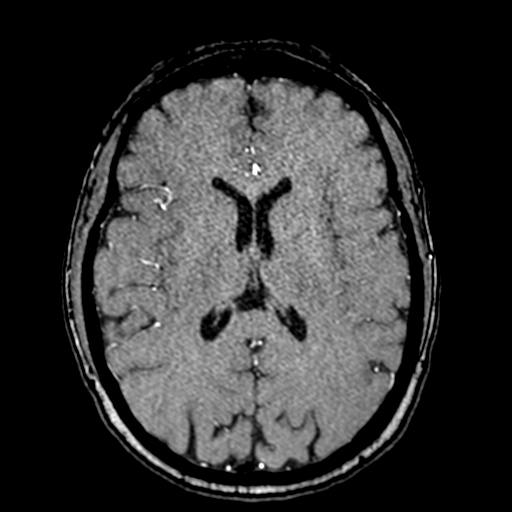
[im 199/208]
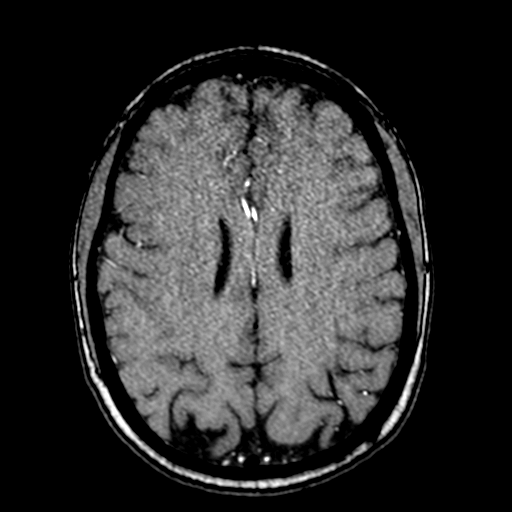

[19 of 48 positions shown; findings below may reference images not displayed]

FINDINGS: MRI HEAD FINDINGS

Brain: No acute infarction, hemorrhage, hydrocephalus, extra-axial
collection or mass lesion.

Vascular: Normal arterial flow voids

Skull and upper cervical spine: Negative

Sinuses/Orbits: Negative

Other: None

MRA HEAD FINDINGS

Both vertebral arteries patent to the basilar. Mild stenosis distal
left vertebral artery. Basilar widely patent with mild
atherosclerotic irregularity proximally. Posterior cerebral arteries
widely patent bilaterally.

Irregular signal in the pre cavernous carotid bilaterally. This is
relatively symmetric and probably is artifact due to motion.
Anterior and middle cerebral arteries widely patent bilaterally.

Negative for cerebral aneurysm.

MRA NECK FINDINGS

Normal aortic arch.

Antegrade flow in the vertebral arteries bilaterally without
stenosis.

Carotid bifurcation widely patent bilaterally. Negative for carotid
stenosis.
IMPRESSION: 1. Normal MRI brain
2. Normal MRA neck
3. MRI head degraded by motion particularly in the region distal
left vertebral artery and in the cavernous carotid bilaterally.
Otherwise normal intracranial circulation.

## 2019-08-06 IMAGING — MR MR MRA NECK WO/W CM
3 series · 41 of 48 positions shown · IV contrast (gadavist)
Comparison: CT head [DATE]

CLINICAL DATA: TIA.  Blurred vision left eye.

EXAM:
MRI HEAD WITHOUT   CONTRAST
MRA HEAD WITHOUT CONTRAST
MRA NECK WITHOUT AND WITH CONTRAST
TECHNIQUE: Multiplanar, multiecho pulse sequences of the brain and surrounding
structures were obtained without intravenous contrast. Angiographic
images of the Circle of Willis were obtained using MRA technique
without intravenous contrast. Angiographic images of the neck were
obtained using MRA technique without and with intravenous contrast.
Carotid stenosis measurements (when applicable) are obtained
utilizing NASCET criteria, using the distal internal carotid
diameter as the denominator.
CONTRAST:  10mL GADAVIST GADOBUTROL 1 MMOL/ML IV SOLN

[Series 7: tof_fl3d_tra_iso · axial · 0.6mm · 0.52mm/px · z∈[-204,-113]mm · 21 of 164 slices shown]
[im 1/164]
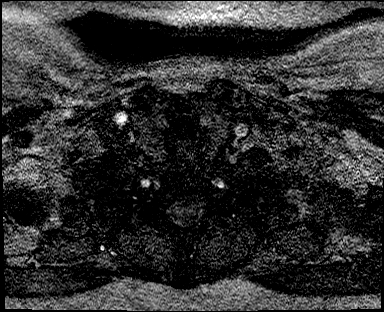
[im 7/164]
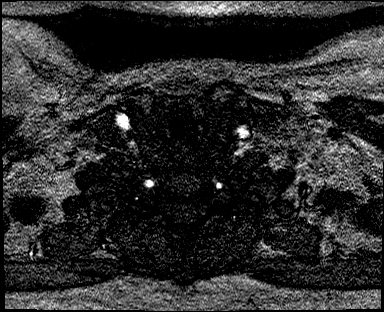
[im 14/164]
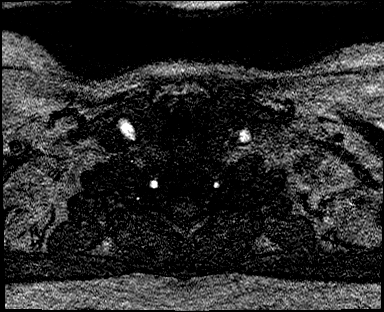
[im 21/164]
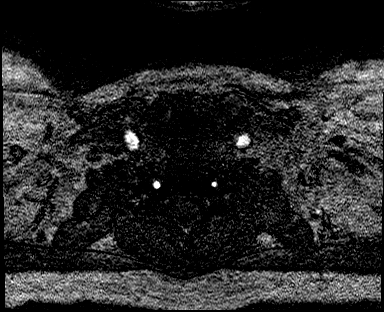
[im 28/164]
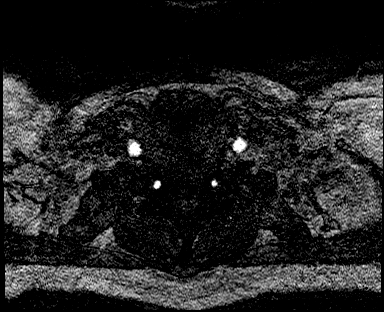
[im 34/164]
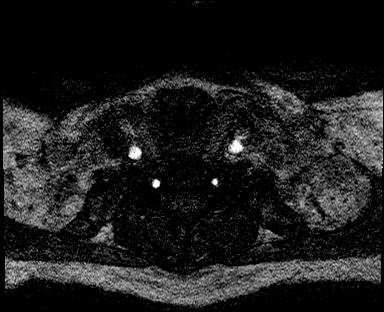
[im 41/164]
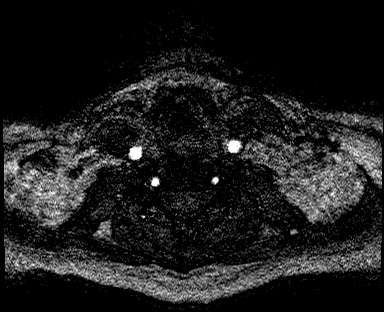
[im 48/164]
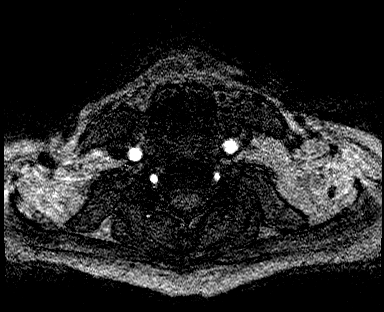
[im 55/164]
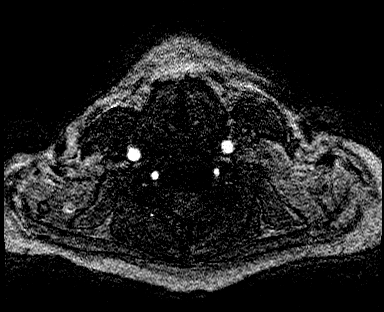
[im 62/164]
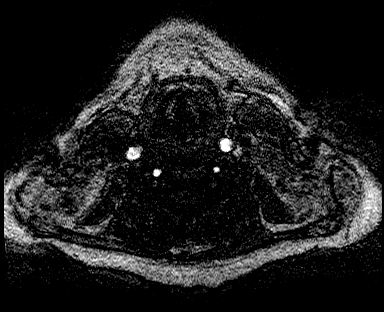
[im 68/164]
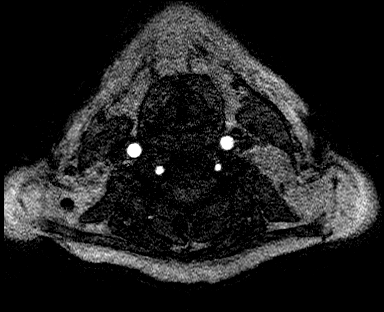
[im 75/164]
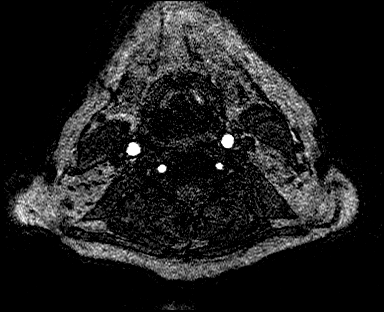
[im 82/164]
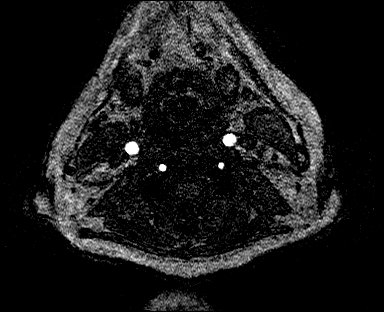
[im 89/164]
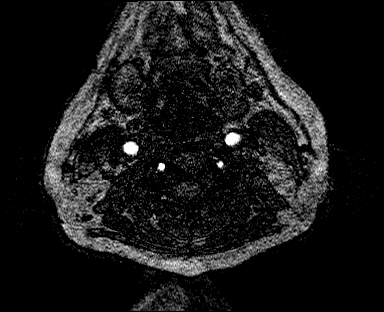
[im 96/164]
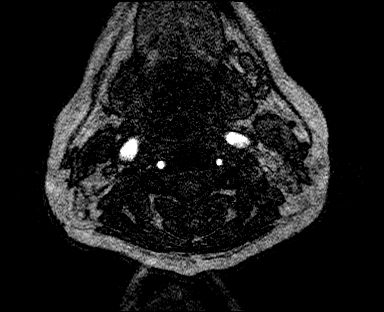
[im 102/164]
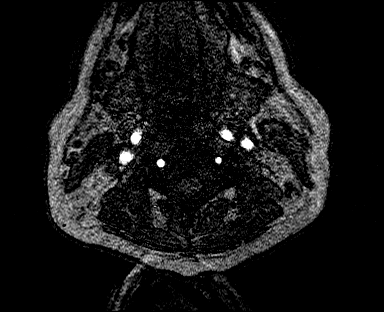
[im 109/164]
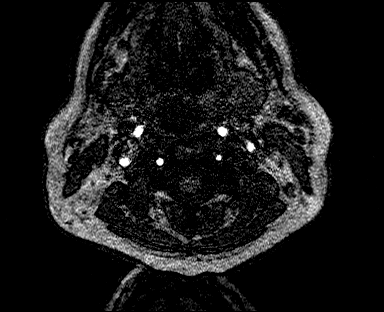
[im 116/164]
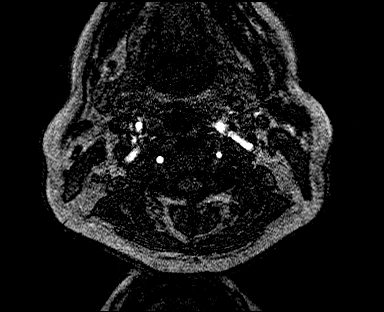
[im 123/164]
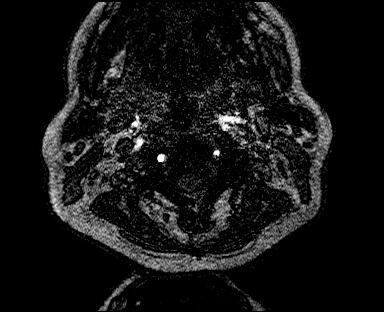
[im 136/164]
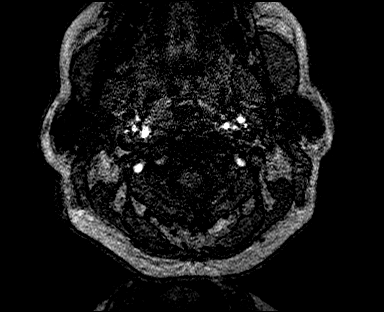
[im 157/164]
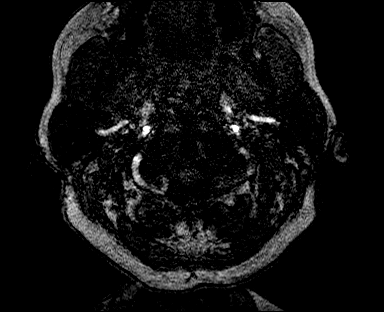

[Series 13: angio_fl3d_cor_post_ttc=3.0s_moco-adv · coronal · 0.9mm · 0.85mm/px · 9 of 80 slices shown]
[im 1/80]
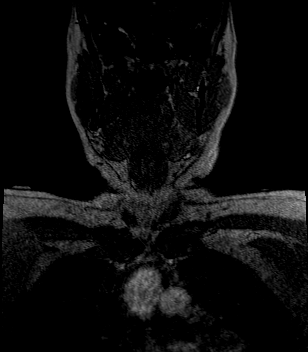
[im 15/80]
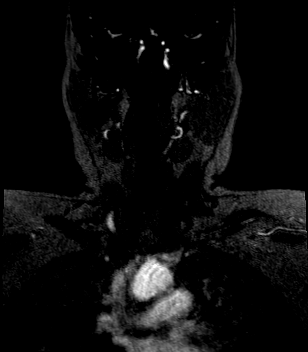
[im 22/80]
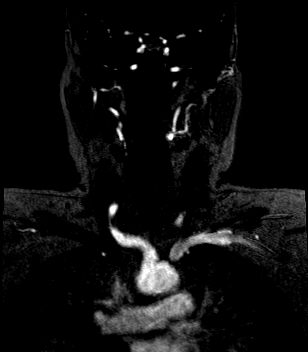
[im 36/80]
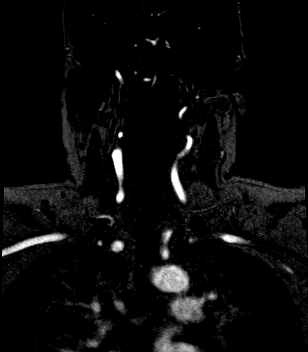
[im 44/80]
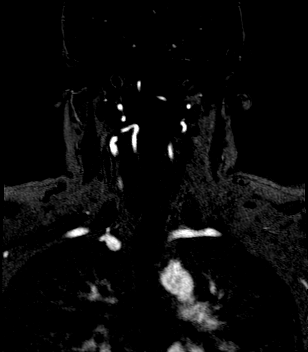
[im 58/80]
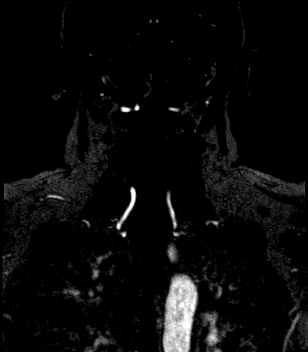
[im 65/80]
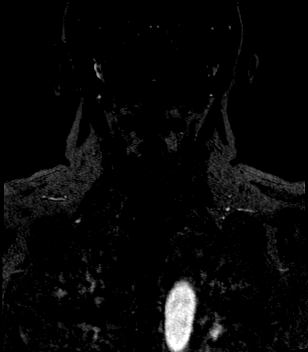
[im 72/80]
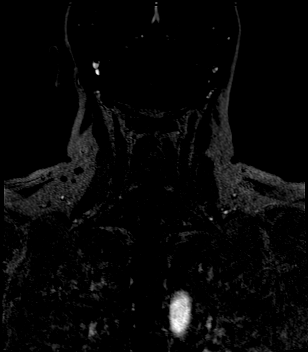
[im 80/80]
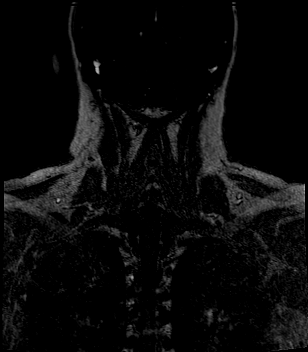

[Series 14: angio_fl3d_cor_post_ttc=3.0s_moco-adv_sub · coronal · 0.9mm · 0.85mm/px · 11 of 76 slices shown]
[im 1/76]
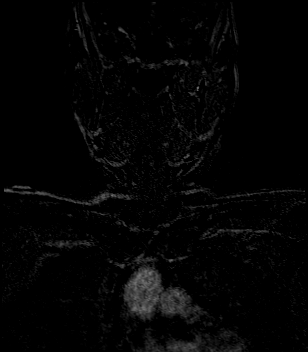
[im 8/76]
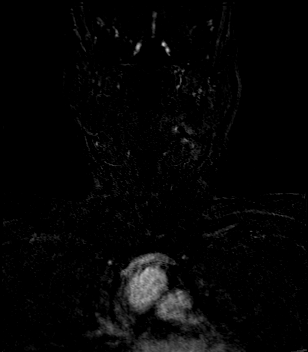
[im 16/76]
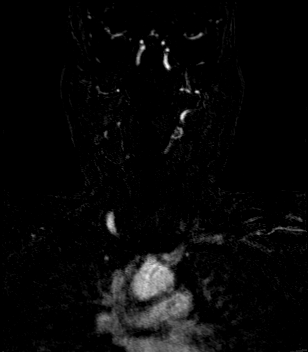
[im 23/76]
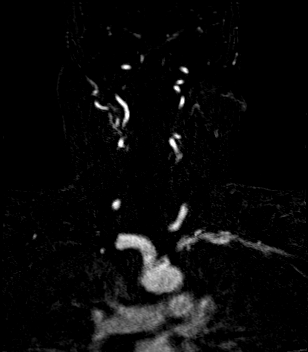
[im 31/76]
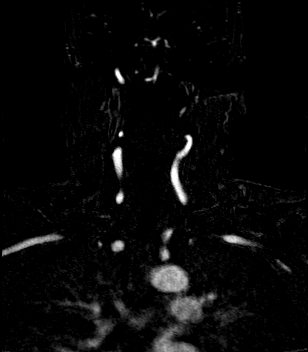
[im 38/76]
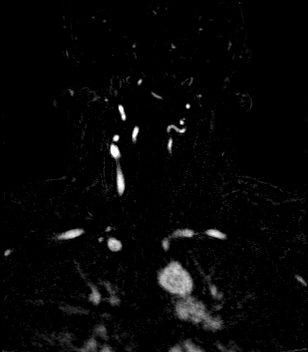
[im 46/76]
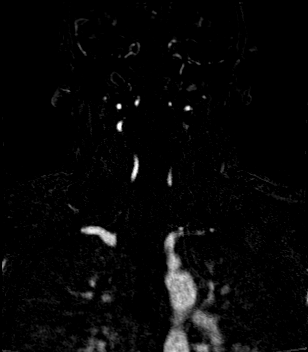
[im 53/76]
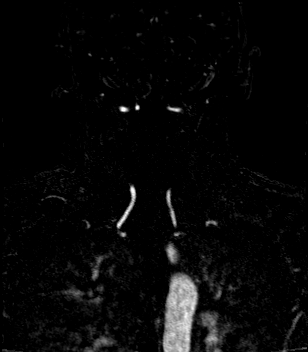
[im 61/76]
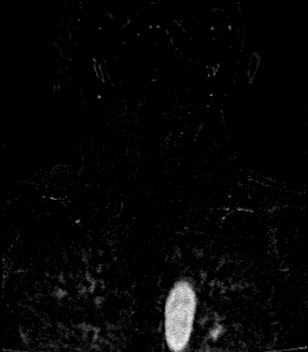
[im 68/76]
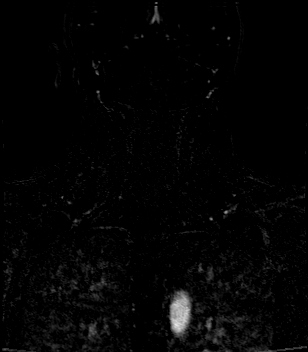
[im 76/76]
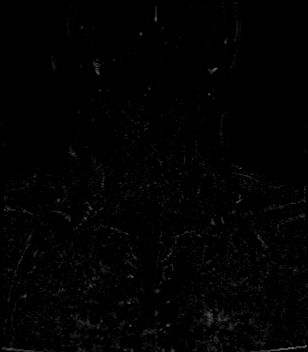

[41 of 48 positions shown; findings below may reference images not displayed]

FINDINGS: MRI HEAD FINDINGS

Brain: No acute infarction, hemorrhage, hydrocephalus, extra-axial
collection or mass lesion.

Vascular: Normal arterial flow voids

Skull and upper cervical spine: Negative

Sinuses/Orbits: Negative

Other: None

MRA HEAD FINDINGS

Both vertebral arteries patent to the basilar. Mild stenosis distal
left vertebral artery. Basilar widely patent with mild
atherosclerotic irregularity proximally. Posterior cerebral arteries
widely patent bilaterally.

Irregular signal in the pre cavernous carotid bilaterally. This is
relatively symmetric and probably is artifact due to motion.
Anterior and middle cerebral arteries widely patent bilaterally.

Negative for cerebral aneurysm.

MRA NECK FINDINGS

Normal aortic arch.

Antegrade flow in the vertebral arteries bilaterally without
stenosis.

Carotid bifurcation widely patent bilaterally. Negative for carotid
stenosis.
IMPRESSION: 1. Normal MRI brain
2. Normal MRA neck
3. MRI head degraded by motion particularly in the region distal
left vertebral artery and in the cavernous carotid bilaterally.
Otherwise normal intracranial circulation.

## 2019-08-06 IMAGING — MR MR HEAD W/O CM
11 of 12 series · 43 of 48 positions shown · IV contrast (gadavist)
Comparison: CT head [DATE]

CLINICAL DATA: TIA.  Blurred vision left eye.

EXAM:
MRI HEAD WITHOUT   CONTRAST
MRA HEAD WITHOUT CONTRAST
MRA NECK WITHOUT AND WITH CONTRAST
TECHNIQUE: Multiplanar, multiecho pulse sequences of the brain and surrounding
structures were obtained without intravenous contrast. Angiographic
images of the Circle of Willis were obtained using MRA technique
without intravenous contrast. Angiographic images of the neck were
obtained using MRA technique without and with intravenous contrast.
Carotid stenosis measurements (when applicable) are obtained
utilizing NASCET criteria, using the distal internal carotid
diameter as the denominator.
CONTRAST:  10mL GADAVIST GADOBUTROL 1 MMOL/ML IV SOLN

[Series 5: DWI · axial · 3.0mm · 0.88mm/px · z∈[-106,+28]mm · 7 of 92 slices shown (1 of 4)]
[im 1/92]
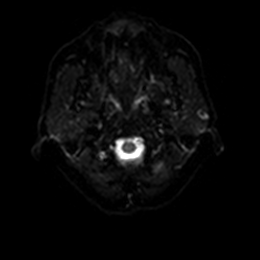
[im 16/92]
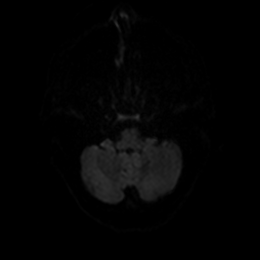
[im 31/92]
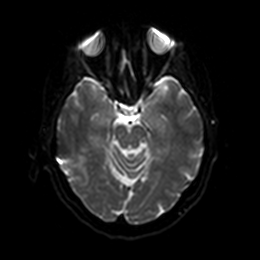
[im 46/92]
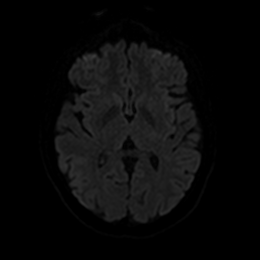
[im 61/92]
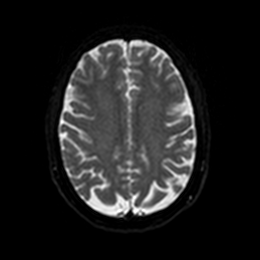
[im 76/92]
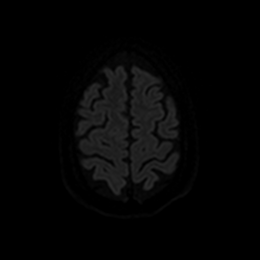
[im 92/92]
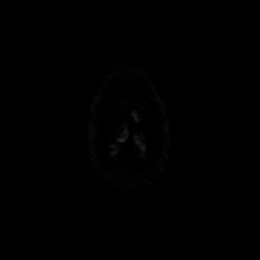

[Series 6: DWI · axial · 3.0mm · 0.88mm/px · z∈[-106,+28]mm · 4 of 46 slices shown (2 of 4)]
[im 1/46]
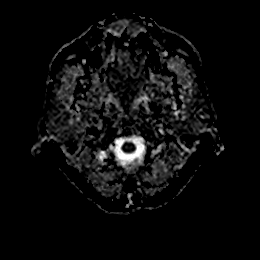
[im 16/46]
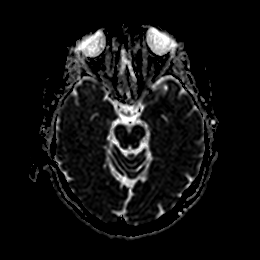
[im 31/46]
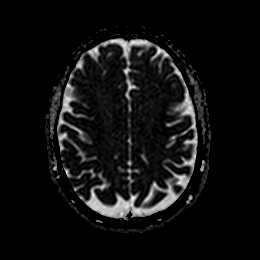
[im 46/46]
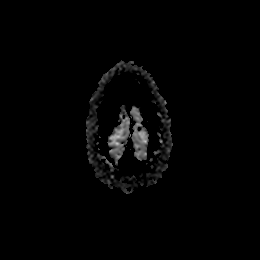

[Series 7: DWI · coronal · 4.0mm · 0.88mm/px · 6 of 64 slices shown (3 of 4)]
[im 1/64]
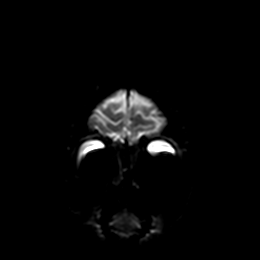
[im 13/64]
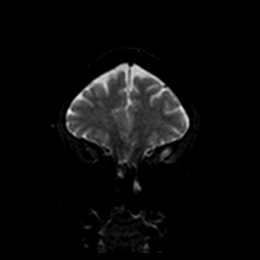
[im 26/64]
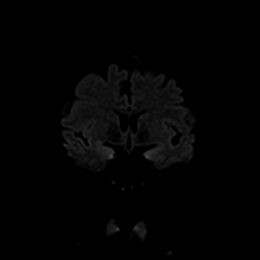
[im 38/64]
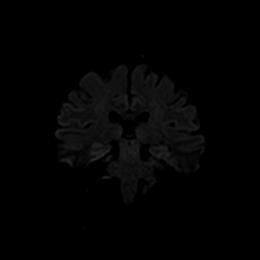
[im 51/64]
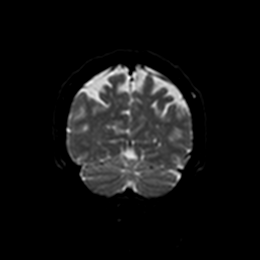
[im 64/64]
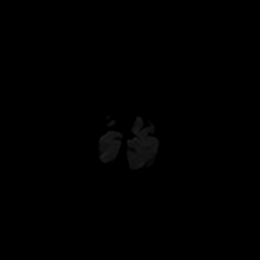

[Series 8: DWI · coronal · 4.0mm · 0.88mm/px · 3 of 32 slices shown (4 of 4)]
[im 1/32]
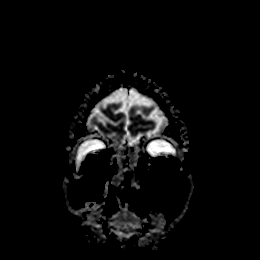
[im 16/32]
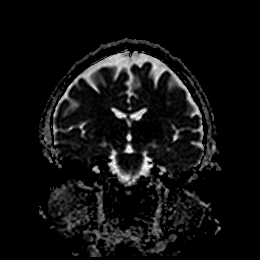
[im 32/32]
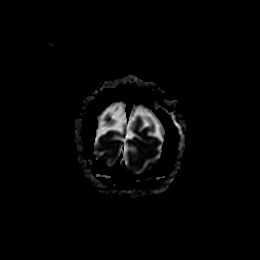

[Series 9: T1 · sagittal · 5.0mm · 0.75mm/px · 2 of 23 slices shown]
[im 1/23]
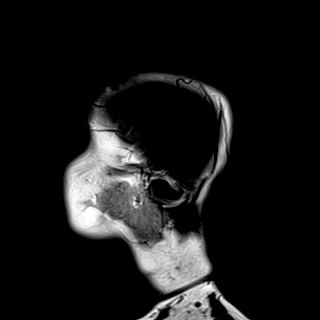
[im 23/23]
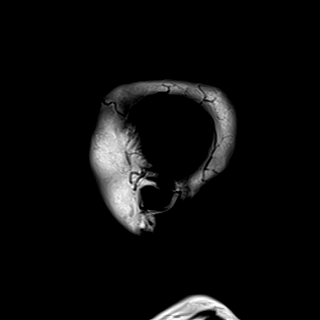

[Series 10: T2 · axial · 5.0mm · 0.72mm/px · z∈[-119,+24]mm · 2 of 25 slices shown (1 of 2)]
[im 1/25]
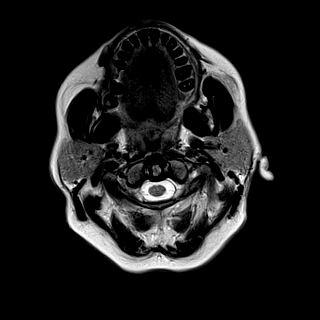
[im 25/25]
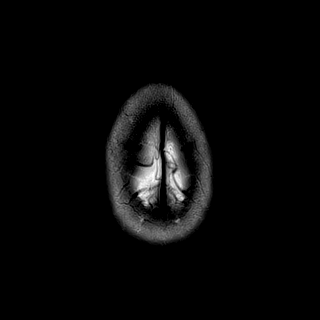

[Series 11: FLAIR · axial · 5.0mm · 0.45mm/px · z∈[-119,+24]mm · 2 of 25 slices shown]
[im 1/25]
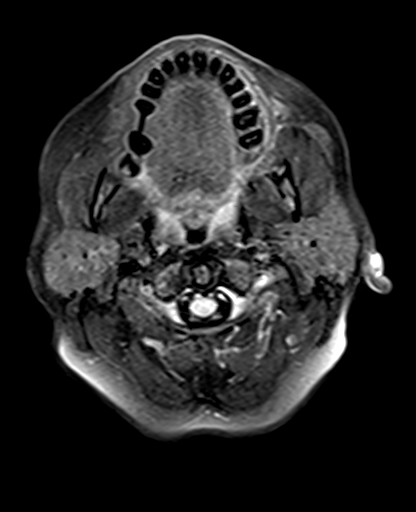
[im 25/25]
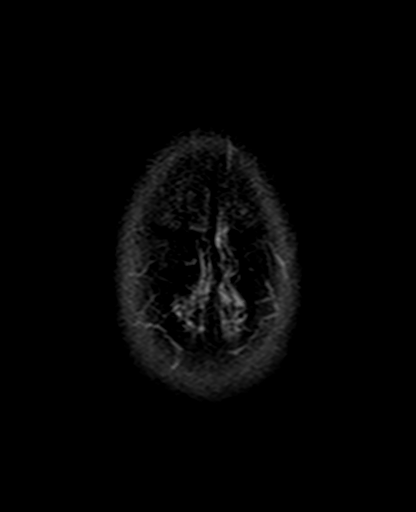

[Series 13: pha_images · axial · 3.0mm · 0.90mm/px · z∈[-120,+32]mm · 5 of 52 slices shown]
[im 1/52]
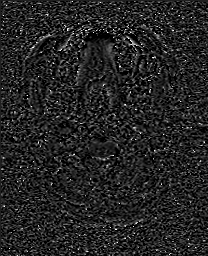
[im 13/52]
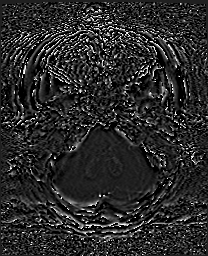
[im 26/52]
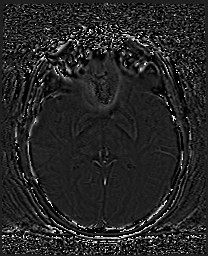
[im 39/52]
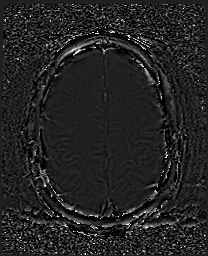
[im 52/52]
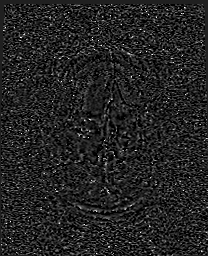

[Series 14: swi_images · axial · 3.0mm · 0.90mm/px · z∈[-120,+32]mm · 5 of 52 slices shown]
[im 1/52]
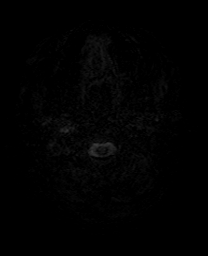
[im 13/52]
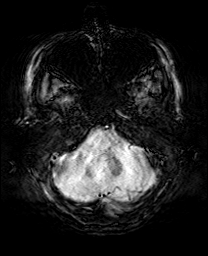
[im 26/52]
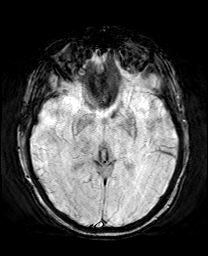
[im 39/52]
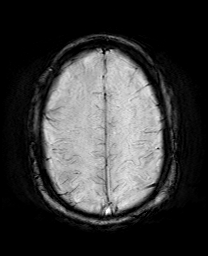
[im 52/52]
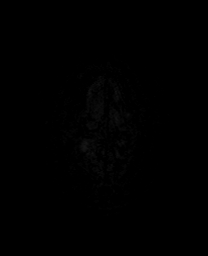

[Series 15: mip_images(sw) · axial · 24.0mm · 0.90mm/px · z∈[-109,+22]mm · 4 of 45 slices shown]
[im 1/45]
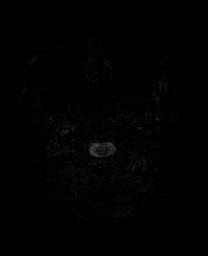
[im 15/45]
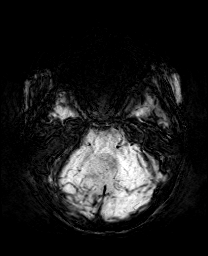
[im 30/45]
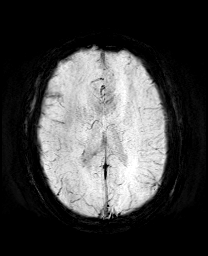
[im 45/45]
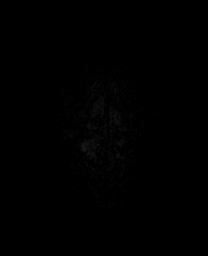

[Series 17: T2 · coronal · 5.0mm · 0.34mm/px · 3 of 29 slices shown (2 of 2)]
[im 1/29]
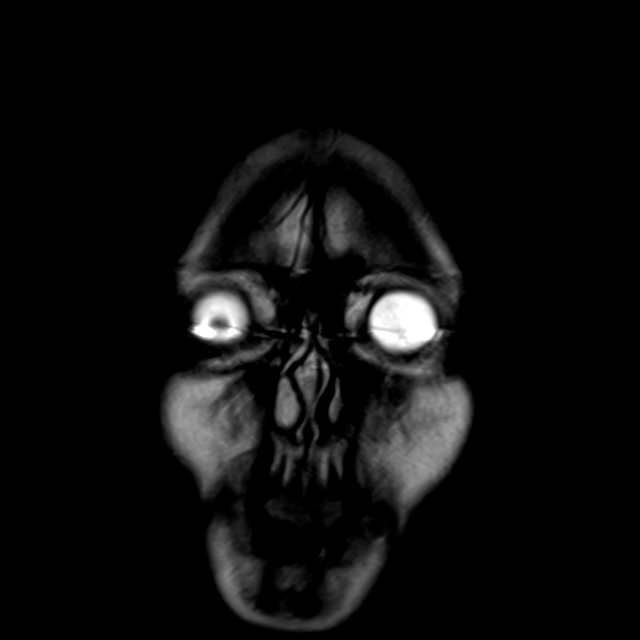
[im 15/29]
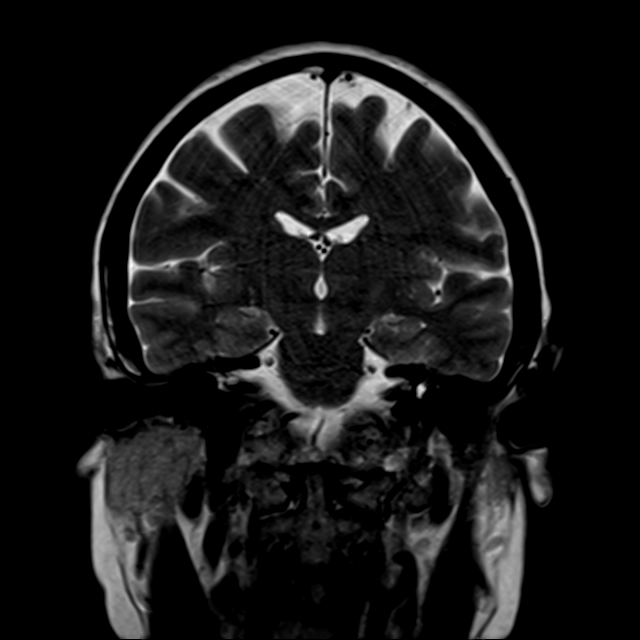
[im 29/29]
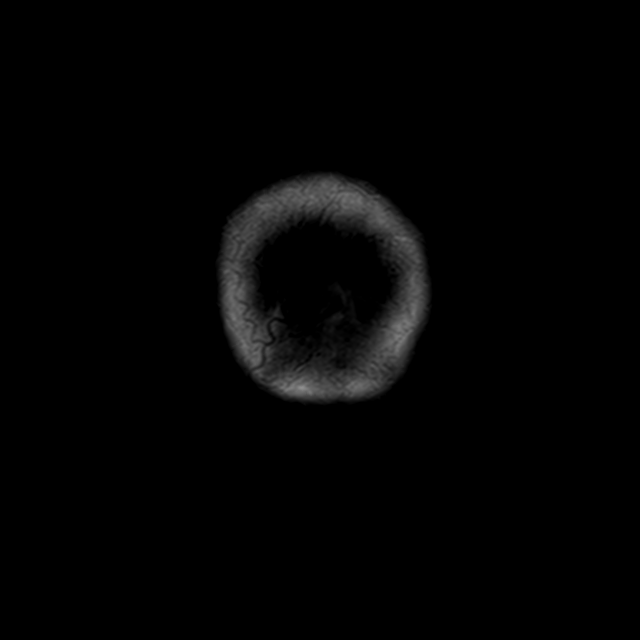

[43 of 48 positions shown; findings below may reference images not displayed]

FINDINGS: MRI HEAD FINDINGS

Brain: No acute infarction, hemorrhage, hydrocephalus, extra-axial
collection or mass lesion.

Vascular: Normal arterial flow voids

Skull and upper cervical spine: Negative

Sinuses/Orbits: Negative

Other: None

MRA HEAD FINDINGS

Both vertebral arteries patent to the basilar. Mild stenosis distal
left vertebral artery. Basilar widely patent with mild
atherosclerotic irregularity proximally. Posterior cerebral arteries
widely patent bilaterally.

Irregular signal in the pre cavernous carotid bilaterally. This is
relatively symmetric and probably is artifact due to motion.
Anterior and middle cerebral arteries widely patent bilaterally.

Negative for cerebral aneurysm.

MRA NECK FINDINGS

Normal aortic arch.

Antegrade flow in the vertebral arteries bilaterally without
stenosis.

Carotid bifurcation widely patent bilaterally. Negative for carotid
stenosis.
IMPRESSION: 1. Normal MRI brain
2. Normal MRA neck
3. MRI head degraded by motion particularly in the region distal
left vertebral artery and in the cavernous carotid bilaterally.
Otherwise normal intracranial circulation.

## 2019-08-06 MED ORDER — LORAZEPAM 2 MG/ML IJ SOLN: 0.5000 mg | Freq: Once | INTRAMUSCULAR | Status: DC

## 2019-08-06 MED ORDER — LORAZEPAM 2 MG/ML IJ SOLN
0.5000 mg | INTRAMUSCULAR | Status: DC | PRN
  Administered 2019-08-06: 16:00:00 0.5 mg via INTRAVENOUS
  Filled 2019-08-06: qty 1

## 2019-08-06 MED ORDER — GADOBUTROL 1 MMOL/ML IV SOLN
10.0000 mL | Freq: Once | INTRAVENOUS | Status: AC | PRN
  Administered 2019-08-06: 10 mL via INTRAVENOUS

## 2019-08-06 NOTE — ED Notes (Signed)
Patient transported to MRI 

## 2019-08-06 NOTE — ED Triage Notes (Signed)
Per [facility] pt with acute onset Left eye vision went "gray" at 1030 this am, vision subsided then began having Left side facial numbness associated w/Left side weakness. No neuro Left side deficits. Stroke screen negative.  97.2 162/90 90 95 RA 18 114 CBG    Dr. Johnney Killian to triage to evaluate

## 2019-08-06 NOTE — Discharge Instructions (Addendum)
°  If your blood pressure (BP) was elevated on multiple readings during this visit above 130 for the top number or above 80 for the bottom number, please have this repeated by your primary care provider within one month. You can also check your blood pressure when you are out at a pharmacy or grocery store. Many have machines that will check your blood pressure.  If your blood pressure remains elevated, please follow-up with your PCP.

## 2019-08-06 NOTE — ED Provider Notes (Signed)
  Physical Exam  BP (!) 152/101   Pulse 83   Temp 98.4 F (36.9 C) (Oral)   Resp 18   Ht 5\' 1"  (1.549 m)   Wt 86.2 kg   SpO2 94%   BMI 35.91 kg/m   Physical Exam  ED Course/Procedures   Clinical Course as of Aug 06 1827  Tue Aug 06, 2019  1618 Triaged as code stroke. 10:30 am loss of vision of the left eye lasting 1-2 minutes followed by LUE paraesthesia which were persistent. Neg head Ct. Pending MRI. If MRI neg can send home if improved. Patient HTN today. Evaluated by Dr. Leonie Man who feels this may be atypical migraine.    [KM]  1822 MRIs were negative for acute findings. Patient reports feeling improved. She does still have some residual paraesthesias in the left side of her face but no extremity involvement or vision changes.  Discussed MRI findings with patient and significant other at bedside.  Questions answered.  Advised to follow-up with neurology and her primary care doctor.  Patient stable and agrees with discharge.   [KM]    Clinical Course User Index [KM] Alveria Apley, PA-C    Procedures  MDM  Patient care assumed from [facility] due to change of shift.       Alveria Apley, PA-C 08/06/19 Greer Ee    Daleen Bo, MD 08/06/19 [phone]

## 2019-08-06 NOTE — ED Provider Notes (Signed)
[facility] EMERGENCY DEPARTMENT Provider Note   CSN: [id number] Arrival date & time: 08/06/19  1145  An emergency department physician performed an initial assessment on this suspected stroke patient at 1146.  History   Chief Complaint Chief Complaint  Patient presents with  . Numbness  . Code Stroke    HPI UVA BORSETH is a 53 y.o. female with history of irregular and heavy periods presents for evaluation of acute onset, persistent paresthesias of the left side of the face and left upper extremity since 10:45AM today. Reports yesterday around 11am she developed mild frontal headache not unlike headaches she has experienced in the past and "floaters" to both eyes which lasted for a few minutes. She took ibuprofen for the headache and ate lunch with resolution of headache. Woke up today in her usual state of health. Around 10:30AM while at work she states her left eye vision "went grey" and she could not see anything out of that eye for 1-2 minutes.  She states that she then stood up and stepped out of the building and started walking around.  Within 15 minutes she developed sudden onset numbness and tingling of the left side of the face traveling to the left upper extremity.  This has been constant since then.  Reports some chest tightness that began while she was in the CT scanner which she thinks is due to anxiety.  Denies nausea, vomiting, shortness of breath, fevers, difficulty swallowing.  Feels as though the left upper extremity is weak but she thinks that her paresthesias are contributing to this.  She is a non-smoker, denies recreational drug use, drinks 1 glass of wine nightly.  No history of hypertension, hyperlipidemia, or diabetes.  Code stroke was activated in triage.     The history is provided by the patient.    Past Medical History:  Diagnosis Date  . Heavy periods   . Irregular periods     Patient Active Problem List   Diagnosis Date Noted  .  Special screening for malignant neoplasms, colon     Past Surgical History:  Procedure Laterality Date  . APPENDECTOMY    . BREAST BIOPSY Left 12/03/2013   neg  . CHOLECYSTECTOMY    . COLONOSCOPY N/A 08/03/2018   Procedure: COLONOSCOPY;  Surgeon: Danie Binder, MD;  [facility];  Service: Endoscopy;  Laterality: N/A;  9:30  . GALLBLADDER SURGERY    . POLYPECTOMY  08/03/2018   Procedure: POLYPECTOMY;  Surgeon: Danie Binder, MD;  Location[facility];  Service: Endoscopy;;     OB History    Gravida  4   Para      Term      Preterm      AB  1   Living  3     SAB      TAB  1   Ectopic      Multiple      Live Births  3            Home Medications    Prior to Admission medications   Medication Sig Start Date End Date Taking? Authorizing Provider  cholecalciferol (VITAMIN D) 1000 units tablet Take 1,000 Units by mouth daily.    [provider]  ibuprofen (ADVIL,MOTRIN) 200 MG tablet Take 400 mg by mouth daily as needed for headache or moderate pain.    [provider]  KARIVA 0.15-0.02/0.01 MG (21/5) tablet TAKE 1 TABLET BY MOUTH ONCE DAILY. 03/30/18  Shambley, Melody N, CNM  loratadine (CLARITIN) 10 MG tablet Take 10 mg by mouth daily.    [provider]  Multiple Vitamin (MULTIVITAMIN WITH MINERALS) TABS tablet Take 1 tablet by mouth daily.    [provider]  vitamin B-12 (CYANOCOBALAMIN) 1000 MCG tablet Take 1,000 mcg by mouth daily.    [provider]    Family History Family History  Problem Relation Age of Onset  . Cancer Maternal Uncle        lung  . Cancer Paternal Aunt        lung  . Cancer Paternal Grandfather        lung,liver  . Colon cancer Neg Hx   . Colon polyps Neg Hx     Social History Social History   Tobacco Use  . Smoking status: Former Research scientist (life sciences)  . Smokeless tobacco: Never Used  Substance Use Topics  . Alcohol use: Yes    Comment: occas  . Drug use: No      Allergies   Sulfa antibiotics   Review of Systems Review of Systems  Constitutional: Negative for chills and fever.  Eyes: Positive for visual disturbance (Resolved).  Respiratory: Positive for chest tightness. Negative for shortness of breath.   Cardiovascular: Negative for chest pain.  Gastrointestinal: Negative for abdominal pain, nausea and vomiting.  Neurological: Positive for weakness, numbness and headaches (Resolved). Negative for syncope and light-headedness.  All other systems reviewed and are negative.    Physical Exam Updated Vital Signs BP (!) 156/84   Pulse 78   Temp 98.4 F (36.9 C) (Oral)   Resp 18   Ht 5\' 1"  (1.549 m)   Wt 86.2 kg   SpO2 97%   BMI 35.91 kg/m   Physical Exam Vitals signs and nursing note reviewed.  Constitutional:      General: She is not in acute distress.    Appearance: She is well-developed.  HENT:     Head: Normocephalic and atraumatic.  Eyes:     General:        Right eye: No discharge.        Left eye: No discharge.     Extraocular Movements: Extraocular movements intact.     Conjunctiva/sclera: Conjunctivae normal.     Pupils: Pupils are equal, round, and reactive to light.  Neck:     Vascular: No JVD.     Trachea: No tracheal deviation.  Cardiovascular:     Rate and Rhythm: Normal rate and regular rhythm.  Pulmonary:     Effort: Pulmonary effort is normal.     Breath sounds: Normal breath sounds.  Abdominal:     General: Bowel sounds are normal. There is no distension.     Palpations: Abdomen is soft.     Tenderness: There is no abdominal tenderness. There is no guarding or rebound.  Musculoskeletal:        General: No tenderness.  Skin:    General: Skin is warm and dry.     Findings: No erythema.  Neurological:     Mental Status: She is alert and oriented to person, place, and time.     Sensory: Sensory deficit present.     Comments: Fluent speech with no evidence of dysarthria or aphasia, no facial droop.   Cranial nerves II through XII tested appear grossly intact aside from altered sensation to light touch of the left side of the face.  Altered sensation to light touch of the left upper extremity.  5/5 strength of  BUE and BLE major muscle groups.  No dysmetria with finger-to-nose of the bilateral upper extremities.  Romberg sign absent.  No pronator drift.  Ambulatory with steady gait and balance, feels a little unsteady with heel walk and toe walk.  Psychiatric:        Behavior: Behavior normal.      ED Treatments / Results  Labs (all labs ordered are listed, but only abnormal results are displayed) Labs Reviewed  COMPREHENSIVE METABOLIC PANEL - Abnormal; Notable for the following components:      Result Value   CO2 20 (*)    Glucose, Bld 102 (*)    All other components within normal limits  URINALYSIS, ROUTINE W REFLEX MICROSCOPIC - Abnormal; Notable for the following components:   Color, Urine RED (*)    APPearance TURBID (*)    Glucose, UA   (*)    Value: TEST NOT REPORTED DUE TO COLOR INTERFERENCE OF URINE PIGMENT   Hgb urine dipstick   (*)    Value: TEST NOT REPORTED DUE TO COLOR INTERFERENCE OF URINE PIGMENT   Bilirubin Urine   (*)    Value: TEST NOT REPORTED DUE TO COLOR INTERFERENCE OF URINE PIGMENT   Ketones, ur   (*)    Value: TEST NOT REPORTED DUE TO COLOR INTERFERENCE OF URINE PIGMENT   Protein, ur   (*)    Value: TEST NOT REPORTED DUE TO COLOR INTERFERENCE OF URINE PIGMENT   Nitrite   (*)    Value: TEST NOT REPORTED DUE TO COLOR INTERFERENCE OF URINE PIGMENT   Leukocytes,Ua   (*)    Value: TEST NOT REPORTED DUE TO COLOR INTERFERENCE OF URINE PIGMENT   All other components within normal limits  URINALYSIS, MICROSCOPIC (REFLEX) - Abnormal; Notable for the following components:   Bacteria, UA FEW (*)    All other components within normal limits  I-STAT CHEM 8, ED - Abnormal; Notable for the following components:   Glucose, Bld 100 (*)    TCO2 20 (*)    All other  components within normal limits  PROTIME-INR  APTT  CBC  DIFFERENTIAL  RAPID URINE DRUG SCREEN, HOSP PERFORMED  I-STAT BETA HCG BLOOD, ED ([facility], [facility], [facility] ONLY)  I-STAT CHEM 8, ED  TROPONIN I (HIGH SENSITIVITY)  TROPONIN I (HIGH SENSITIVITY)    EKG None  Radiology Ct Head Code Stroke Wo Contrast  Result Date: 08/06/2019 CLINICAL DATA:  Code stroke. Additional history obtained: Left-sided numbness EXAM: CT HEAD WITHOUT CONTRAST TECHNIQUE: Contiguous axial images were obtained from the base of the skull through the vertex without intravenous contrast. COMPARISON:  No pertinent prior studies available for comparison. FINDINGS: Brain: No evidence of acute intracranial hemorrhage. No demarcated cortical infarction. No evidence of intracranial mass. No midline shift or extra-axial fluid collection. Vascular: No hyperdense vessel Skull: Normal. Negative for fracture or focal lesion. Sinuses/Orbits: Negative ASPECTS ([place] Stroke Program Early CT Score) - Ganglionic level infarction (caudate, lentiform nuclei, internal capsule, insula, M1-M3 cortex): 7 - Supraganglionic infarction (M4-M6 cortex): 3 Total score (0-10 with 10 being normal): 10 These results were called by telephone at the time of interpretation on 08/06/2019 at 12:17 pm to provider Dr. Lorraine Lax, Who verbally acknowledged these results. IMPRESSION: Normal non-contrast head CT without evidence of acute intracranial abnormality. ASPECTS 10. Electronically Signed   By: Kellie Simmering DO   On: 08/06/2019 12:19    Procedures Procedures (including critical care time)  Medications Ordered in ED Medications  LORazepam (ATIVAN) injection 0.5 mg (  0.5 mg Intravenous Given 08/06/19 1538)     Initial Impression / Assessment and Plan / ED Course  I have reviewed the triage vital signs and the nursing notes.  Pertinent labs & imaging results that were available during my care of the patient were reviewed by me and considered in my medical decision  making (see chart for details).  Patient presenting for evaluation of sudden onset left eye vision change and paresthesias of the left side of the face and left upper extremity.  Vision changes lasted 1 to 2 minutes and then resolved and have not recurred.  She is afebrile, persistently mildly to moderately hypertensive in the ED.  She is nontoxic in appearance.  Has subjectively altered sensation to the left side of the face and left upper extremity.  Strength is intact.  No other neurologic deficits noted.  Code stroke was initiated in triage and she was assessed by Dr. Leonie Man with neurology.  Noncontrast Head CT was negative, but he recommended MRI and MRA of the head and neck for further evaluation to rule out stroke or other acute intracranial abnormality.  If studies are within normal limits and patient's symptoms have improved then she will likely be stable for discharge home.  Lab work reviewed by me shows no leukocytosis, no anemia, no metabolic derangements, no renal insufficiency.  UA was unable to be analyzed due to contamination from menstrual blood though the patient has no urinary symptoms.  She did mention some chest tightness but she thinks this is related to anxiety.  Her EKG is nonischemic and initial troponin is negative.  5:06 PM Signed out care to oncoming provider PA [facility].  Plan to follow-up on imaging and reassess patient.  If any evidence of acute CVA may require admission to the hospital for further evaluation and management.  Otherwise if imaging is negative, second troponin is negative, and she is feeling better then she will likely be stable for discharge home.  She should follow-up with PCP for reevaluation of her hypertension.   Final Clinical Impressions(s) / ED Diagnoses   Final diagnoses:  Paresthesia  Vision loss of left eye  Hypertension, unspecified type    ED Discharge Orders    None       Debroah Baller 08/06/19 1706    Milton Ferguson, MD  08/08/19 1810

## 2019-08-06 NOTE — Consult Note (Addendum)
Stroke Consult   Ref MD : Dr Dewayne Hatch Reason for referral : code stroke Chief Complaint: left sided numbness HPI: Erin Howard is an 53 y.o. female   with no significant past medical history except heavy menorrhagia on birth control pills presents with symptoms of sudden onset of left eye graying and loss of vision at 10:30 AM while at work.  This lasted only a few minutes and resolved.  She was not feeling well and stepped out of her office and was walking outside when she noticed sudden onset of numbness involving left face which within a few minutes spread to involve the left arm and leg.  She felt heavy on the left side but was able to walk and use her left hand.  She denies any headache, slurred speech gait or balance problems.  She did complain of seeing wavy lines some light sensitivity yesterday but this resolved.  She denies prior history of migraine headaches or similar episodes in the past.  She does take birth control pills.  She denies any other significant stroke risk factors except for mild obesity.  She has no prior history of strokes, TIAs, seizures or significant neurological problems.  Code stroke was called in route by EMS and on arrival patient had left lower face upper and lower extremity numbness but no other focal deficits.  LSN: 10:30 AM 08/06/2019 tPA Given: No: Mild symptoms.  NIH 1.  Likely strokelike episode  Past Medical History:  Diagnosis Date  . Heavy periods   . Irregular periods     Past Surgical History:  Procedure Laterality Date  . APPENDECTOMY    . BREAST BIOPSY Left 12/03/2013   neg  . CHOLECYSTECTOMY    . COLONOSCOPY N/A 08/03/2018   Procedure: COLONOSCOPY;  Surgeon: Danie Binder, MD;  [facility];  Service: Endoscopy;  Laterality: N/A;  9:30  . GALLBLADDER SURGERY    . POLYPECTOMY  08/03/2018   Procedure: POLYPECTOMY;  Surgeon: Danie Binder, MD;  [facility];  Service: Endoscopy;;    Family History  Problem Relation Age  of Onset  . Cancer Maternal Uncle        lung  . Cancer Paternal Aunt        lung  . Cancer Paternal Grandfather        lung,liver  . Colon cancer Neg Hx   . Colon polyps Neg Hx    Social History:  reports that she has quit smoking. She has never used smokeless tobacco. She reports current alcohol use. She reports that she does not use drugs.  Allergies:  Allergies  Allergen Reactions  . Sulfa Antibiotics Hives and Shortness Of Breath    (Not in a hospital admission)   ROS: 14 system review of systems positive for vision difficulties, numbness, heaviness and all other systems negative  Physical Examination: Blood pressure (!) 147/90, pulse 78, temperature 98.4 F (36.9 C), temperature source Oral, resp. rate 12, height 5\' 1"  (1.549 m), weight 86.2 kg, SpO2 96 %.  Pleasant obese middle-aged Caucasian lady currently not in distress. . Afebrile. Head is nontraumatic. Neck is supple without bruit.    Cardiac exam no murmur or gallop. Lungs are clear to auscultation. Distal pulses are well felt. Neurologic Examination: Awake alert oriented to time place and person.  Appears mildly anxious.  No aphasia apraxia or dysarthria.  Extraocular movements are full range without nystagmus.  Blinks to threat bilaterally.  Visual fields seem full to bedside confrontational  testing.  Pupils are equal reactive.  Fundi not visualized.  Face is symmetric.  Tongue midline.  Motor system exam symmetric upper and lower extremity strength.  No focal weakness.  Coordination is slow but accurate.  Subjective diminished touch pinprick sensation in the left lower face arm and leg.  Vibration and position sense are preserved.  She does not split the forehead or midline.  Deep tendon reflexes symmetric.  Plantars downgoing.  Gait not tested.  NIH stroke scale 1  Baseline modified Rankin scale 0  Results for orders placed or performed during the hospital encounter of 08/06/19 (from the past 48 hour(s))   Protime-INR     Status: None   Collection Time: 08/06/19 12:04 PM  Result Value Ref Range   Prothrombin Time 12.8 11.4 - 15.2 seconds   INR 1.0 0.8 - 1.2    Comment: (NOTE) INR goal varies based on device and disease states. Performed at [facility], [place] [address] [place], [place] [address]   APTT     Status: None   Collection Time: 08/06/19 12:04 PM  Result Value Ref Range   aPTT 27 24 - 36 seconds    Comment: Performed at [facility] [address] [place], [place] [address]  CBC     Status: None   Collection Time: 08/06/19 12:04 PM  Result Value Ref Range   WBC 9.2 4.0 - 10.5 K/uL   RBC 4.53 3.87 - 5.11 MIL/uL   Hemoglobin 13.5 12.0 - 15.0 g/dL   HCT 41.4 36.0 - 46.0 %   MCV 91.4 80.0 - 100.0 fL   MCH 29.8 26.0 - 34.0 pg   MCHC 32.6 30.0 - 36.0 g/dL   RDW 12.8 11.5 - 15.5 %   Platelets 300 150 - 400 K/uL   nRBC 0.0 0.0 - 0.2 %    Comment: Performed at [facility], [place] [address], [place], [place] [address]  Differential     Status: None   Collection Time: 08/06/19 12:04 PM  Result Value Ref Range   Neutrophils Relative % 62 %   Neutro Abs 5.7 1.7 - 7.7 K/uL   Lymphocytes Relative 29 %   Lymphs Abs 2.6 0.7 - 4.0 K/uL   Monocytes Relative 6 %   Monocytes Absolute 0.6 0.1 - 1.0 K/uL   Eosinophils Relative 2 %   Eosinophils Absolute 0.2 0.0 - 0.5 K/uL   Basophils Relative 1 %   Basophils Absolute 0.1 0.0 - 0.1 K/uL   Immature Granulocytes 0 %   Abs Immature Granulocytes 0.03 0.00 - 0.07 K/uL    Comment: Performed at [facility] [address], [place], [place] [address]  Comprehensive metabolic panel     Status: Abnormal   Collection Time: 08/06/19 12:04 PM  Result Value Ref Range   Sodium 137 135 - 145 mmol/L   Potassium 3.7 3.5 - 5.1 mmol/L   Chloride 105 98 - 111 mmol/L   CO2 20 (L) 22 - 32 mmol/L   Glucose, Bld 102 (H) 70 - 99 mg/dL   BUN 10 6 - 20 mg/dL   Creatinine, Ser 0.66 0.44 - 1.00 mg/dL   Calcium 9.5 8.9 - 10.3 mg/dL    Total Protein 6.7 6.5 - 8.1 g/dL   Albumin 3.7 3.5 - 5.0 g/dL   AST 32 15 - 41 U/L   ALT 29 0 - 44 U/L   Alkaline Phosphatase 49 38 - 126 U/L   Total  Bilirubin 0.5 0.3 - 1.2 mg/dL   GFR calc non Af Amer >60 >60 mL/min   GFR calc Af Amer >60 >60 mL/min   Anion gap 12 5 - 15    Comment: Performed at [facility] [address], [place], [place] [address]  I-Stat beta hCG blood, ED     Status: None   Collection Time: 08/06/19 12:08 PM  Result Value Ref Range   I-stat hCG, quantitative <5.0 <5 mIU/mL   Comment 3            Comment:   GEST. AGE      CONC.  (mIU/mL)   <=1 WEEK        5 - 50     2 WEEKS       50 - 500     3 WEEKS       100 - 10,000     4 WEEKS     1,000 - 30,000        FEMALE AND NON-PREGNANT FEMALE:     LESS THAN 5 mIU/mL   I-stat chem 8, ed     Status: Abnormal   Collection Time: 08/06/19 12:10 PM  Result Value Ref Range   Sodium 139 135 - 145 mmol/L   Potassium 3.7 3.5 - 5.1 mmol/L   Chloride 106 98 - 111 mmol/L   BUN 10 6 - 20 mg/dL   Creatinine, Ser 0.50 0.44 - 1.00 mg/dL   Glucose, Bld 100 (H) 70 - 99 mg/dL   Calcium, Ion 1.15 1.15 - 1.40 mmol/L   TCO2 20 (L) 22 - 32 mmol/L   Hemoglobin 14.3 12.0 - 15.0 g/dL   HCT 42.0 36.0 - 46.0 %   Ct Head Code Stroke Wo Contrast  Result Date: 08/06/2019 CLINICAL DATA:  Code stroke. Additional history obtained: Left-sided numbness EXAM: CT HEAD WITHOUT CONTRAST TECHNIQUE: Contiguous axial images were obtained from the base of the skull through the vertex without intravenous contrast. COMPARISON:  No pertinent prior studies available for comparison. FINDINGS: Brain: No evidence of acute intracranial hemorrhage. No demarcated cortical infarction. No evidence of intracranial mass. No midline shift or extra-axial fluid collection. Vascular: No hyperdense vessel Skull: Normal. Negative for fracture or focal lesion. Sinuses/Orbits: Negative ASPECTS ([place] Stroke Program Early CT Score) - Ganglionic level infarction  (caudate, lentiform nuclei, internal capsule, insula, M1-M3 cortex): 7 - Supraganglionic infarction (M4-M6 cortex): 3 Total score (0-10 with 10 being normal): 10 These results were called by telephone at the time of interpretation on 08/06/2019 at 12:17 pm to provider Dr. Lorraine Lax, Who verbally acknowledged these results. IMPRESSION: Normal non-contrast head CT without evidence of acute intracranial abnormality. ASPECTS 10. Electronically Signed   By: Kellie Simmering DO   On: 08/06/2019 12:19    Assessment: 53 y.o. 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  Doubt stroke. She has presented within time window for TPA but her presentation is slightly atypical for stroke and she has nondisabling symptoms mostly paresthesias hence risk-benefit is not in favor of giving TPA at the moment.  However will keep close neurological observation while she is in the TPA window and may reconsider in case there is clinical worsening.  Check MRI scan of the brain and MRA of the brain and neck.  May discharge the patient if MRI is negative for stroke.  Discussed with Dr. Roderic Palau ER physician who concurs with  the plan.  Long discussion with patient and answered questions.  If stroke is confirmed she may need to be admitted for further work-up and stroke with stratification and may need to discontinue birth control pills.  Stroke team will follow.  Greater than 50% time during this 80-minute consultation visit was spent on counseling and coordination of care about TIA and atypical migraine and answering questions and discussion with care team Antony Contras, MD 08/06/2019, 1:23 PM

## 2019-08-06 NOTE — ED Notes (Signed)
Activated code stroke with thomas from [facility]

## 2019-08-06 NOTE — Code Documentation (Signed)
53 yo female who reports to be on birth control with complaints of left eye visual changes and left numbness that started at approximately 1030. Pt was at work at her desk when her left eye "went completely gray." The symptoms subsided and the patient got up to walk. While walking, she noted left leg, arm, and facial "heaviness". Pt drove in by EMS. EMS did not activate a Code Stroke because they didn't appreciate any focal deficits. Triage activated a Code Stroke. Stroke Team met patient in CT> Initial NIHSS 1 due to left sided decreased sensation. No other deficits noted. Reported headache and vision changes yesterday that subsided. Pt is too mild to treat with tPA at this time. Pt to have MRI and be evaluated in the ED. Handoff given to [place], Therapist, sports.

## 2019-12-04 ENCOUNTER — Other Ambulatory Visit: Payer: Self-pay | Admitting: Obstetrics and Gynecology

## 2021-07-12 ENCOUNTER — Encounter: Payer: Self-pay | Admitting: *Deleted

## 2023-04-19 ENCOUNTER — Other Ambulatory Visit: Payer: Self-pay | Admitting: Family Medicine

## 2023-04-19 DIAGNOSIS — Z1231 Encounter for screening mammogram for malignant neoplasm of breast: Secondary | ICD-10-CM

## 2023-05-09 ENCOUNTER — Inpatient Hospital Stay
Admission: RE | Admit: 2023-05-09 | Discharge: 2023-05-09 | Disposition: A | Discharge status: Home / Self Care | Payer: Self-pay | Source: Ambulatory Visit | Attending: Nurse Practitioner | Admitting: Nurse Practitioner

## 2023-05-09 ENCOUNTER — Other Ambulatory Visit: Payer: Self-pay | Admitting: *Deleted

## 2023-05-09 DIAGNOSIS — Z1231 Encounter for screening mammogram for malignant neoplasm of breast: Secondary | ICD-10-CM

## 2023-05-31 ENCOUNTER — Ambulatory Visit
Admission: RE | Admit: 2023-05-31 | Discharge: 2023-05-31 | Disposition: A | Discharge status: Home / Self Care | Payer: Managed Care, Other (non HMO) | Source: Ambulatory Visit | Attending: Family Medicine | Admitting: Family Medicine

## 2023-05-31 DIAGNOSIS — Z1231 Encounter for screening mammogram for malignant neoplasm of breast: Secondary | ICD-10-CM | POA: Insufficient documentation

## 2023-06-29 ENCOUNTER — Ambulatory Visit (INDEPENDENT_AMBULATORY_CARE_PROVIDER_SITE_OTHER): Payer: Managed Care, Other (non HMO) | Admitting: Family Medicine

## 2023-06-29 VITALS — BP 172/104 | HR 67 | Ht 61.0 in | Wt 187.0 lb

## 2023-06-29 DIAGNOSIS — S2231XA Fracture of one rib, right side, initial encounter for closed fracture: Secondary | ICD-10-CM | POA: Diagnosis not present

## 2023-06-29 DIAGNOSIS — N200 Calculus of kidney: Secondary | ICD-10-CM | POA: Insufficient documentation

## 2023-06-29 MED ORDER — TIZANIDINE HCL 4 MG PO TABS: 2.0000 mg | ORAL_TABLET | Freq: Three times a day (TID) | ORAL | 1 refills | Status: AC | PRN

## 2023-06-29 NOTE — Patient Instructions (Addendum)
Thank you for coming in today.   You can try a rib binder  Hold a pillow against your chest try help when you have a cough or sneeze  Bed assist ladder and a wedge pillow.   Heating pad could help.   Max dose of ibuprofen is 800mg  every 8 hours.   Max dose of tylenol is 2 tylenol arthritis every 8 hours.  Just use 1 of the tylneol arthritis if you plan on also taking the hydrocodone.   Use tiziindine muscle relaxer if needed mostly at bedtime.   Recheck in about 1 month.   Let me know sooner if this is not working.   This will hurt real bad for a few weeks.

## 2023-06-29 NOTE — Progress Notes (Signed)
   Rubin Payor, PhD, LAT, ATC acting as a scribe for Clementeen Graham, MD.  Erin Howard is a 57 y.o. female who presents to [facility] at [facility] today for rib pain ongoing since Saturday. She was hedge trimming on her deck, which was wet, and she slipped, hitting her rib cage on the deck rail. Pt locates pain to the R-side of her rib cage, under her R breast. She went to [facility]. No SOB.   Aggravates: sneezing, coughing, deep breathing Treatments tried: IBU, aspirin, hydrocodone  Pertinent review of systems: No fever or chills  Relevant historical information: History of kidney stone   Exam:  BP (!) 172/104   Pulse 67   Ht 5\' 1"  (1.549 m)   Wt 187 lb (84.8 kg)   LMP 07/17/2018   SpO2 96%   BMI 35.33 kg/m  General: Well Developed, well nourished, and in no acute distress.   MSK: Right chest wall tender palpation anterior chest wall. Lungs clear to auscultation bilaterally. Heart regular rate and rhythm no murmurs rubs or gallops.    Lab and Radiology Results  X-ray images AP chest and right rib views obtained on October 2 provided by patient on a CD personally independently interpreted today. No clear displaced rib fracture is visible.  I do not see an obvious fracture on the limited CD viewing software. Radiology report from that visit does show a nondisplaced right anterior rib fracture at the fifth rib. This report will be sent to scan.    Assessment and Plan: 57 y.o. female with right rib pain after a fall occurring Saturday, September 28.  Urgent care x-ray visits show rib fracture.  I was unable to see the fracture myself on the CD images.  Regardless we will treat as no rib fracture or bruised ribs.  Will use rib binder [employer]ist latter wedge pillow heating pad and tizanidine.  Recommend maximizing Tylenol and ibuprofen.  We discussed safe dosing of this medication.  She has not found hydrocodone to be very useful so we can use it only for  severe pain. Recommend deep inspiration every 30 minutes while awake to prevent atelectasis.  Recheck in 1 month or sooner if needed.  Letter written for work from home.Marland Kitchen   PDMP not reviewed this encounter. No orders of the defined types were placed in this encounter.  Meds ordered this encounter  Medications   tiZANidine (ZANAFLEX) 4 MG tablet    Sig: Take 0.5-1 tablets (2-4 mg total) by mouth every 8 (eight) hours as needed.    Dispense:  30 tablet    Refill:  1     Discussed warning signs or symptoms. Please see discharge instructions. Patient expresses understanding.   The above documentation has been reviewed and is accurate and complete Clementeen Graham, M.D.

## 2023-07-27 ENCOUNTER — Ambulatory Visit: Payer: Managed Care, Other (non HMO) | Admitting: Family Medicine
